# Patient Record
Sex: Female | Born: 1960 | Race: Black or African American | Hispanic: No | Smoking: Current every day smoker
Health system: Southern US, Community
[De-identification: ages and names within clinical notes are randomized; demographics above are authoritative.]

## PROBLEM LIST (undated history)

## (undated) DIAGNOSIS — F32A Depression, unspecified: Secondary | ICD-10-CM

## (undated) DIAGNOSIS — F329 Major depressive disorder, single episode, unspecified: Secondary | ICD-10-CM

## (undated) DIAGNOSIS — M1990 Unspecified osteoarthritis, unspecified site: Secondary | ICD-10-CM

## (undated) DIAGNOSIS — IMO0002 Reserved for concepts with insufficient information to code with codable children: Secondary | ICD-10-CM

## (undated) DIAGNOSIS — I10 Essential (primary) hypertension: Secondary | ICD-10-CM

## (undated) DIAGNOSIS — M5430 Sciatica, unspecified side: Secondary | ICD-10-CM

## (undated) HISTORY — PX: DILATION AND CURETTAGE OF UTERUS: SHX78

## (undated) HISTORY — DX: Essential (primary) hypertension: I10

## (undated) HISTORY — PX: BREAST SURGERY: SHX581

## (undated) HISTORY — DX: Unspecified osteoarthritis, unspecified site: M19.90

## (undated) HISTORY — DX: Depression, unspecified: F32.A

## (undated) HISTORY — DX: Reserved for concepts with insufficient information to code with codable children: IMO0002

## (undated) HISTORY — PX: EYE SURGERY: SHX253

## (undated) HISTORY — DX: Major depressive disorder, single episode, unspecified: F32.9

## (undated) HISTORY — DX: Sciatica, unspecified side: M54.30

---

## 1997-04-23 ENCOUNTER — Encounter (HOSPITAL_COMMUNITY): Admission: RE | Admit: 1997-04-23 | Discharge: 1997-07-22 | Payer: Self-pay | Admitting: Psychiatry

## 1997-07-23 ENCOUNTER — Inpatient Hospital Stay (HOSPITAL_COMMUNITY): Admission: EM | Admit: 1997-07-23 | Discharge: 1997-07-27 | Payer: Self-pay | Admitting: Emergency Medicine

## 1998-12-25 ENCOUNTER — Inpatient Hospital Stay (HOSPITAL_COMMUNITY): Admission: EM | Admit: 1998-12-25 | Discharge: 1998-12-30 | Payer: Self-pay | Admitting: *Deleted

## 1999-01-03 ENCOUNTER — Other Ambulatory Visit: Admission: RE | Admit: 1999-01-03 | Discharge: 1999-01-11 | Payer: Self-pay

## 1999-07-16 ENCOUNTER — Emergency Department (HOSPITAL_COMMUNITY): Admission: EM | Admit: 1999-07-16 | Discharge: 1999-07-16 | Payer: Self-pay | Admitting: *Deleted

## 2000-07-14 ENCOUNTER — Emergency Department (HOSPITAL_COMMUNITY): Admission: EM | Admit: 2000-07-14 | Discharge: 2000-07-14 | Payer: Self-pay | Admitting: Emergency Medicine

## 2000-08-01 ENCOUNTER — Emergency Department (HOSPITAL_COMMUNITY): Admission: EM | Admit: 2000-08-01 | Discharge: 2000-08-01 | Payer: Self-pay | Admitting: Emergency Medicine

## 2000-08-13 ENCOUNTER — Emergency Department (HOSPITAL_COMMUNITY): Admission: EM | Admit: 2000-08-13 | Discharge: 2000-08-13 | Payer: Self-pay | Admitting: Emergency Medicine

## 2001-10-14 ENCOUNTER — Emergency Department (HOSPITAL_COMMUNITY): Admission: EM | Admit: 2001-10-14 | Discharge: 2001-10-14 | Payer: Self-pay | Admitting: Emergency Medicine

## 2002-03-02 ENCOUNTER — Emergency Department (HOSPITAL_COMMUNITY): Admission: EM | Admit: 2002-03-02 | Discharge: 2002-03-02 | Payer: Self-pay | Admitting: Emergency Medicine

## 2002-08-19 ENCOUNTER — Emergency Department (HOSPITAL_COMMUNITY): Admission: EM | Admit: 2002-08-19 | Discharge: 2002-08-19 | Payer: Self-pay | Admitting: Emergency Medicine

## 2002-12-26 ENCOUNTER — Emergency Department (HOSPITAL_COMMUNITY): Admission: EM | Admit: 2002-12-26 | Discharge: 2002-12-26 | Payer: Self-pay | Admitting: Emergency Medicine

## 2005-07-18 ENCOUNTER — Emergency Department (HOSPITAL_COMMUNITY): Admission: EM | Admit: 2005-07-18 | Discharge: 2005-07-18 | Payer: Self-pay | Admitting: Emergency Medicine

## 2005-07-30 ENCOUNTER — Emergency Department (HOSPITAL_COMMUNITY): Admission: EM | Admit: 2005-07-30 | Discharge: 2005-07-30 | Payer: Self-pay | Admitting: Family Medicine

## 2007-10-25 ENCOUNTER — Emergency Department (HOSPITAL_COMMUNITY): Admission: EM | Admit: 2007-10-25 | Discharge: 2007-10-25 | Payer: Self-pay | Admitting: Emergency Medicine

## 2007-11-20 ENCOUNTER — Emergency Department (HOSPITAL_COMMUNITY): Admission: EM | Admit: 2007-11-20 | Discharge: 2007-11-20 | Payer: Self-pay | Admitting: Emergency Medicine

## 2008-02-12 ENCOUNTER — Emergency Department (HOSPITAL_COMMUNITY): Admission: EM | Admit: 2008-02-12 | Discharge: 2008-02-12 | Payer: Self-pay | Admitting: Emergency Medicine

## 2008-02-14 ENCOUNTER — Emergency Department (HOSPITAL_COMMUNITY): Admission: EM | Admit: 2008-02-14 | Discharge: 2008-02-14 | Payer: Self-pay | Admitting: Emergency Medicine

## 2008-03-16 ENCOUNTER — Emergency Department (HOSPITAL_COMMUNITY): Admission: EM | Admit: 2008-03-16 | Discharge: 2008-03-16 | Payer: Self-pay | Admitting: Emergency Medicine

## 2008-03-20 ENCOUNTER — Emergency Department (HOSPITAL_COMMUNITY): Admission: EM | Admit: 2008-03-20 | Discharge: 2008-03-20 | Payer: Self-pay | Admitting: Emergency Medicine

## 2008-03-24 ENCOUNTER — Emergency Department (HOSPITAL_COMMUNITY): Admission: EM | Admit: 2008-03-24 | Discharge: 2008-03-24 | Payer: Self-pay | Admitting: Emergency Medicine

## 2008-04-01 ENCOUNTER — Emergency Department (HOSPITAL_COMMUNITY): Admission: EM | Admit: 2008-04-01 | Discharge: 2008-04-01 | Payer: Self-pay | Admitting: Emergency Medicine

## 2008-04-08 ENCOUNTER — Ambulatory Visit: Payer: Self-pay | Admitting: Sports Medicine

## 2008-04-08 DIAGNOSIS — M25519 Pain in unspecified shoulder: Secondary | ICD-10-CM

## 2008-04-08 DIAGNOSIS — S139XXA Sprain of joints and ligaments of unspecified parts of neck, initial encounter: Secondary | ICD-10-CM | POA: Insufficient documentation

## 2008-04-08 DIAGNOSIS — I10 Essential (primary) hypertension: Secondary | ICD-10-CM

## 2009-02-16 ENCOUNTER — Inpatient Hospital Stay (HOSPITAL_COMMUNITY): Admission: EM | Admit: 2009-02-16 | Discharge: 2009-02-18 | Payer: Self-pay | Admitting: Emergency Medicine

## 2009-07-13 ENCOUNTER — Emergency Department (HOSPITAL_COMMUNITY): Admission: EM | Admit: 2009-07-13 | Discharge: 2009-07-14 | Payer: Self-pay | Admitting: Emergency Medicine

## 2009-08-08 ENCOUNTER — Emergency Department (HOSPITAL_BASED_OUTPATIENT_CLINIC_OR_DEPARTMENT_OTHER): Admission: EM | Admit: 2009-08-08 | Discharge: 2009-08-08 | Payer: Self-pay | Admitting: Emergency Medicine

## 2009-08-08 ENCOUNTER — Ambulatory Visit: Payer: Self-pay | Admitting: Diagnostic Radiology

## 2010-04-08 LAB — URINALYSIS, ROUTINE W REFLEX MICROSCOPIC
Bilirubin Urine: NEGATIVE
Glucose, UA: NEGATIVE mg/dL
Hgb urine dipstick: NEGATIVE
Ketones, ur: NEGATIVE mg/dL
Nitrite: NEGATIVE
Protein, ur: NEGATIVE mg/dL
Specific Gravity, Urine: 1.016 (ref 1.005–1.030)
Urobilinogen, UA: 0.2 mg/dL (ref 0.0–1.0)
pH: 7.5 (ref 5.0–8.0)

## 2010-04-08 LAB — BASIC METABOLIC PANEL
BUN: 14 mg/dL (ref 6–23)
CO2: 26 mEq/L (ref 19–32)
Calcium: 8.9 mg/dL (ref 8.4–10.5)
Chloride: 107 mEq/L (ref 96–112)
Creatinine, Ser: 0.8 mg/dL (ref 0.4–1.2)
GFR calc Af Amer: >60 mL/min (ref >60)
GFR calc non Af Amer: >60 mL/min (ref >60)
Glucose, Bld: 97 mg/dL (ref 70–99)
Potassium: 3.9 mEq/L (ref 3.5–5.1)
Sodium: 143 mEq/L (ref 135–145)

## 2010-04-08 LAB — PREGNANCY, URINE: Preg Test, Ur: NEGATIVE

## 2010-04-09 LAB — BASIC METABOLIC PANEL
BUN: 5 mg/dL — ABNORMAL LOW (ref 6–23)
BUN: 6 mg/dL (ref 6–23)
CO2: 26 mEq/L (ref 19–32)
Calcium: 8.5 mg/dL (ref 8.4–10.5)
Chloride: 100 mEq/L (ref 96–112)
Chloride: 106 mEq/L (ref 96–112)
Creatinine, Ser: 0.83 mg/dL (ref 0.4–1.2)
Creatinine, Ser: 0.88 mg/dL (ref 0.4–1.2)
GFR calc Af Amer: >60 mL/min (ref >60)
GFR calc non Af Amer: >60 mL/min (ref >60)
Glucose, Bld: 104 mg/dL — ABNORMAL HIGH (ref 70–99)
Glucose, Bld: 106 mg/dL — ABNORMAL HIGH (ref 70–99)
Potassium: 3.1 mEq/L — ABNORMAL LOW (ref 3.5–5.1)
Sodium: 135 mEq/L (ref 135–145)

## 2010-04-09 LAB — COMPREHENSIVE METABOLIC PANEL
ALT: 17 U/L (ref 0–35)
AST: 20 U/L (ref 0–37)
Alkaline Phosphatase: 50 U/L (ref 39–117)
CO2: 27 mEq/L (ref 19–32)
Calcium: 9.3 mg/dL (ref 8.4–10.5)
GFR calc Af Amer: >60 mL/min (ref >60)
GFR calc non Af Amer: >60 mL/min (ref >60)
Glucose, Bld: 105 mg/dL — ABNORMAL HIGH (ref 70–99)
Potassium: 3.6 mEq/L (ref 3.5–5.1)
Sodium: 138 mEq/L (ref 135–145)

## 2010-04-09 LAB — URINALYSIS, ROUTINE W REFLEX MICROSCOPIC
Bilirubin Urine: NEGATIVE
Ketones, ur: NEGATIVE mg/dL
Nitrite: NEGATIVE
Protein, ur: NEGATIVE mg/dL
Urobilinogen, UA: 0.2 mg/dL (ref 0.0–1.0)
pH: 5.5 (ref 5.0–8.0)

## 2010-04-09 LAB — CBC
HCT: 21.9 % — ABNORMAL LOW (ref 36.0–46.0)
HCT: 28 % — ABNORMAL LOW (ref 36.0–46.0)
Hemoglobin: 6.6 g/dL — CL (ref 12.0–15.0)
Hemoglobin: 8.3 g/dL — ABNORMAL LOW (ref 12.0–15.0)
MCV: 65.1 fL — ABNORMAL LOW (ref 78.0–100.0)
MCV: 65.2 fL — ABNORMAL LOW (ref 78.0–100.0)
Platelets: 331 10*3/uL (ref 150–400)
Platelets: 362 10*3/uL (ref 150–400)
RBC: 4.21 MIL/uL (ref 3.87–5.11)
RDW: 19.1 % — ABNORMAL HIGH (ref 11.5–15.5)
RDW: 19.6 % — ABNORMAL HIGH (ref 11.5–15.5)
WBC: 5.8 10*3/uL (ref 4.0–10.5)
WBC: 5.9 10*3/uL (ref 4.0–10.5)
WBC: 5.8 10*3/uL (ref 4.0–10.5)

## 2010-04-09 LAB — RAPID URINE DRUG SCREEN, HOSP PERFORMED
Cocaine: POSITIVE — AB
Tetrahydrocannabinol: NOT DETECTED

## 2010-04-09 LAB — DIFFERENTIAL
Basophils Relative: 1 % (ref 0–1)
Eosinophils Absolute: 0.1 10*3/uL (ref 0.0–0.7)
Eosinophils Relative: 1 % (ref 0–5)
Lymphs Abs: 1.8 10*3/uL (ref 0.7–4.0)
Monocytes Absolute: 0.5 10*3/uL (ref 0.1–1.0)
Neutrophils Relative %: 58 % (ref 43–77)

## 2010-04-09 LAB — ETHANOL: Alcohol, Ethyl (B): <5 mg/dL (ref 0–10)

## 2010-05-04 LAB — BASIC METABOLIC PANEL
BUN: 8 mg/dL (ref 6–23)
CO2: 26 mEq/L (ref 19–32)
Calcium: 8.8 mg/dL (ref 8.4–10.5)
Chloride: 105 mEq/L (ref 96–112)
Creatinine, Ser: 0.9 mg/dL (ref 0.4–1.2)
GFR calc Af Amer: >60 mL/min (ref >60)

## 2010-05-08 LAB — POCT PREGNANCY, URINE: Preg Test, Ur: NEGATIVE

## 2010-10-23 LAB — POCT I-STAT, CHEM 8
Calcium, Ion: 1.06 — ABNORMAL LOW
Creatinine, Ser: 1.1
Glucose, Bld: 91
Hemoglobin: 9.9 — ABNORMAL LOW
TCO2: 25

## 2010-10-23 LAB — URINALYSIS, ROUTINE W REFLEX MICROSCOPIC
Glucose, UA: NEGATIVE
Hgb urine dipstick: NEGATIVE
Specific Gravity, Urine: 1.019
Urobilinogen, UA: 0.2
pH: 5.5

## 2010-10-23 LAB — URINE MICROSCOPIC-ADD ON

## 2010-10-23 LAB — CBC
HCT: 27.3 — ABNORMAL LOW
Hemoglobin: 8.6 — ABNORMAL LOW
MCHC: 31.6
Platelets: 461 — ABNORMAL HIGH
RDW: 19.1 — ABNORMAL HIGH

## 2010-10-23 LAB — WET PREP, GENITAL: Clue Cells Wet Prep HPF POC: NONE SEEN

## 2010-10-23 LAB — DIFFERENTIAL
Basophils Absolute: 0.1
Basophils Relative: 2 — ABNORMAL HIGH
Eosinophils Absolute: 0
Eosinophils Relative: 1
Monocytes Absolute: 0.6

## 2010-10-23 LAB — GC/CHLAMYDIA PROBE AMP, GENITAL: GC Probe Amp, Genital: NEGATIVE

## 2010-10-31 ENCOUNTER — Emergency Department (HOSPITAL_COMMUNITY)
Admission: EM | Admit: 2010-10-31 | Discharge: 2010-10-31 | Disposition: A | Discharge status: Home / Self Care | Payer: Self-pay | Attending: Emergency Medicine | Admitting: Emergency Medicine

## 2010-10-31 DIAGNOSIS — M25569 Pain in unspecified knee: Secondary | ICD-10-CM | POA: Insufficient documentation

## 2010-10-31 DIAGNOSIS — Z79899 Other long term (current) drug therapy: Secondary | ICD-10-CM | POA: Insufficient documentation

## 2010-10-31 DIAGNOSIS — M5430 Sciatica, unspecified side: Secondary | ICD-10-CM | POA: Insufficient documentation

## 2010-10-31 DIAGNOSIS — I10 Essential (primary) hypertension: Secondary | ICD-10-CM | POA: Insufficient documentation

## 2010-10-31 DIAGNOSIS — IMO0001 Reserved for inherently not codable concepts without codable children: Secondary | ICD-10-CM | POA: Insufficient documentation

## 2010-10-31 DIAGNOSIS — M79609 Pain in unspecified limb: Secondary | ICD-10-CM | POA: Insufficient documentation

## 2010-11-23 DIAGNOSIS — M5430 Sciatica, unspecified side: Secondary | ICD-10-CM

## 2010-11-23 HISTORY — DX: Sciatica, unspecified side: M54.30

## 2010-12-28 ENCOUNTER — Ambulatory Visit (INDEPENDENT_AMBULATORY_CARE_PROVIDER_SITE_OTHER): Payer: Self-pay | Admitting: Family Medicine

## 2010-12-28 ENCOUNTER — Encounter: Payer: Self-pay | Admitting: Family Medicine

## 2010-12-28 VITALS — BP 182/122 | HR 85 | Temp 97.6°F | Ht 64.0 in | Wt 154.0 lb

## 2010-12-28 DIAGNOSIS — F329 Major depressive disorder, single episode, unspecified: Secondary | ICD-10-CM

## 2010-12-28 DIAGNOSIS — I10 Essential (primary) hypertension: Secondary | ICD-10-CM

## 2010-12-28 DIAGNOSIS — M5430 Sciatica, unspecified side: Secondary | ICD-10-CM

## 2010-12-28 DIAGNOSIS — Z0000 Encounter for general adult medical examination without abnormal findings: Secondary | ICD-10-CM

## 2010-12-28 LAB — BASIC METABOLIC PANEL
BUN: 13 mg/dL (ref 6–23)
Calcium: 10.1 mg/dL (ref 8.4–10.5)
Glucose, Bld: 83 mg/dL (ref 70–99)
Sodium: 142 mEq/L (ref 135–145)

## 2010-12-28 MED ORDER — LISINOPRIL-HYDROCHLOROTHIAZIDE 10-12.5 MG PO TABS: 1.0000 | ORAL_TABLET | Freq: Every day | ORAL | Status: DC

## 2010-12-28 NOTE — Patient Instructions (Signed)
It was great meeting you today! Your blood pressure was high today and I would like to start you on two medications: lisinopril and hydrochlorothiazide. They should not be too expensive. I also need to obtain some labwork before starting the medication and in 3 weeks after starting it. Next visit, I will check your cholesterol, so come fasting if you can.  For your low back pain, I am giving you some exercises to do at home. If the pain gets bad, lets start with tylenol 1-2 tabs every 4 to 6 hrs to help with the pain. If it continues going on we will get some xrays. We can also try to set you up with physical therapy when you get the orange card.

## 2010-12-30 ENCOUNTER — Other Ambulatory Visit: Payer: Self-pay | Admitting: Family Medicine

## 2010-12-30 ENCOUNTER — Encounter: Payer: Self-pay | Admitting: Family Medicine

## 2010-12-30 DIAGNOSIS — Z0000 Encounter for general adult medical examination without abnormal findings: Secondary | ICD-10-CM | POA: Insufficient documentation

## 2010-12-30 DIAGNOSIS — M545 Low back pain: Secondary | ICD-10-CM | POA: Insufficient documentation

## 2010-12-30 DIAGNOSIS — F329 Major depressive disorder, single episode, unspecified: Secondary | ICD-10-CM | POA: Insufficient documentation

## 2010-12-30 DIAGNOSIS — I10 Essential (primary) hypertension: Secondary | ICD-10-CM
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
## 2010-12-30 NOTE — Assessment & Plan Note (Signed)
-   Will check fasting lipid panel when patient follows up.  - Patient filled out form for mammogram scholarship - will obtain pap smear at follow up visit since this has not been done anytime recently and patient does have history of abnormal pap smear - patient given flu shot during visit. Will offer tetanus shot at next visit - patient needs screening colonoscopy
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
## 2010-12-30 NOTE — Assessment & Plan Note (Signed)
Patient given low back pain exercises. Patient to take tylenol for pain and to avoid NSAIDs given elevated blood pressure. If pain is still present at next visit, will obtain xrays. Will also refer to physical therapy once patient obtains orange card.

## 2010-12-30 NOTE — Assessment & Plan Note (Addendum)
Hypertension stage 2: will start on dual agent therapy: lisinopril/hctz 10/12.5. This should be more affordable than norvasc. Will check baseline BMet for Creatinine and potassium levels. Recheck BMet in 2-3 weeks when patient follows up.

## 2011-01-29 ENCOUNTER — Other Ambulatory Visit: Payer: Self-pay | Admitting: Family Medicine

## 2011-01-29 DIAGNOSIS — Z1231 Encounter for screening mammogram for malignant neoplasm of breast: Secondary | ICD-10-CM

## 2011-02-12 ENCOUNTER — Ambulatory Visit (HOSPITAL_COMMUNITY): Payer: Self-pay

## 2011-02-26 ENCOUNTER — Encounter: Payer: Self-pay | Admitting: Family Medicine

## 2011-02-26 ENCOUNTER — Ambulatory Visit (INDEPENDENT_AMBULATORY_CARE_PROVIDER_SITE_OTHER): Payer: Self-pay | Admitting: Family Medicine

## 2011-02-26 VITALS — BP 199/125 | HR 62 | Ht 64.0 in | Wt 162.0 lb

## 2011-02-26 DIAGNOSIS — I10 Essential (primary) hypertension: Secondary | ICD-10-CM

## 2011-02-26 LAB — TSH: TSH: 2.293 u[IU]/mL (ref 0.350–4.500)

## 2011-02-26 LAB — CBC WITH DIFFERENTIAL/PLATELET
Eosinophils Absolute: 0.1 10*3/uL (ref 0.0–0.7)
Eosinophils Relative: 2 % (ref 0–5)
Hemoglobin: 12.7 g/dL (ref 12.0–15.0)
Lymphs Abs: 2.1 10*3/uL (ref 0.7–4.0)
MCH: 28.2 pg (ref 26.0–34.0)
MCV: 89.3 fL (ref 78.0–100.0)
Monocytes Relative: 8 % (ref 3–12)
RBC: 4.5 MIL/uL (ref 3.87–5.11)

## 2011-02-26 LAB — LIPID PANEL
Cholesterol: 214 mg/dL — ABNORMAL HIGH (ref 0–200)
Total CHOL/HDL Ratio: 3.1 Ratio

## 2011-02-26 LAB — COMPREHENSIVE METABOLIC PANEL
CO2: 27 mEq/L (ref 19–32)
Creat: 1.02 mg/dL (ref 0.50–1.10)
Glucose, Bld: 84 mg/dL (ref 70–99)
Total Bilirubin: 0.3 mg/dL (ref 0.3–1.2)

## 2011-02-26 MED ORDER — HYDROCHLOROTHIAZIDE 12.5 MG PO TABS: 12.5000 mg | ORAL_TABLET | Freq: Every day | ORAL | Status: DC

## 2011-02-26 MED ORDER — METOPROLOL TARTRATE 50 MG PO TABS: 50.0000 mg | ORAL_TABLET | Freq: Two times a day (BID) | ORAL | Status: DC

## 2011-02-26 MED ORDER — ASPIRIN 81 MG PO TBEC: 81.0000 mg | DELAYED_RELEASE_TABLET | Freq: Every day | ORAL | Status: AC

## 2011-02-26 NOTE — Progress Notes (Signed)
Patient ID: Elaine [name]    DOB: May 21, 1960, 51 y.o.   MRN: [id number] --- Subjective:  Elaine [name] is a 51 y.o.female who presents for follow up on her blood pressure medication. Was prescribed lisinopril-hctz 2 months ago for hypertension but patient was unable to fill the medication due to lack of money. Got the orange card today.  She denies any headache, any change in vision, any swelling in the legs or any shortness of breath. Did have an episode of of mid chest discomfort yesterday, which was relieved on its own. Was after eating but did not appear to be heartburn. No diaphoresis, no nausea, no vomiting, no radiation of pain, no shortness of breath. No family history of heart disease.    Objective: Filed Vitals:   02/26/11 1429  BP: 199/125  Pulse: 62    Physical Examination:   General appearance - alert, well appearing, and in no distress Nose - normal and patent, no erythema, discharge or polyps Mouth - mucous membranes moist, pharynx normal without lesions Neck - supple, no significant adenopathy Eyes: left eye more sunken in orbit, EOMI Chest - clear to auscultation, no wheezes, rales or rhonchi, symmetric air entry Heart - normal rate, regular rhythm, normal S1, S2, no murmurs, rubs, clicks or gallops Abdomen - soft, nontender, nondistended, no masses or organomegaly Extremities - peripheral pulses normal, trace edema, no clubbing or cyanosis

## 2011-02-26 NOTE — Assessment & Plan Note (Signed)
Hypertension very poorly controled. Stressed the severity of her blood pressure and need to get blood pressure medication filled. Will start her on hydrochlorothiazide 12.5mg  and metoprolol 50mg  po bid which she should be able to get [facility] with the orange card. Will also start patient on daily aspirin.  Will check CBC (given history of anemia), CM, TSH and non fasting lipid panel. Patient to return to clinic by the end of the week to recheck her blood pressure especially with her complaint of chest discomfort.  Precepted with Dr. Leveda Anna.

## 2011-02-26 NOTE — Patient Instructions (Signed)
You need to come back to get your blood pressure checked by the end of the week

## 2011-03-01 ENCOUNTER — Ambulatory Visit (INDEPENDENT_AMBULATORY_CARE_PROVIDER_SITE_OTHER): Payer: Self-pay | Admitting: Family Medicine

## 2011-03-01 ENCOUNTER — Encounter: Payer: Self-pay | Admitting: Family Medicine

## 2011-03-01 VITALS — BP 168/98 | HR 47 | Temp 97.5°F | Ht 64.0 in | Wt 161.0 lb

## 2011-03-01 DIAGNOSIS — I10 Essential (primary) hypertension: Secondary | ICD-10-CM

## 2011-03-01 NOTE — Patient Instructions (Addendum)
Thank you for coming in today. Please continue taking Hydrochrloathizide 12.5 mg daily. Look at your bottle. Are pills 25 or 12.5?  Take 1/2 pill of the metoporol twice a day.  Consider getting a pill box with morning and evening dosing so you can remember to take your pills.  Let Elaine [name] know if you feel light headed or dizzy.  We will sort you blood pressure medicines out eventually.  It will take some trial and error.. Let Elaine [name] know if you feel bad or have chest pains or trouble breathing.  Please follow up with Dr. Gwenlyn Saran or myself in 1 week.

## 2011-03-02 NOTE — Progress Notes (Signed)
Elaine [name] is a 51 y.o. female who presents to [facility] today for HTn follow up. Was seen recently by PCP who started HCTZ and Metoporol. She filled and started taking the medications yesterday. She feel well without any chest pain, palpitations, dyspnea, dizziness or syncope.    PMH reviewed. Sig for HTN ROS as above otherwise neg Medications reviewed. Current Outpatient Prescriptions  Medication Sig Dispense Refill  . aspirin 81 MG EC tablet Take 1 tablet (81 mg total) by mouth daily. Swallow whole.  30 tablet  12  . buPROPion (WELLBUTRIN SR) 150 MG 12 hr tablet Take 150 mg by mouth 2 (two) times daily.        . hydrochlorothiazide (HYDRODIURIL) 12.5 MG tablet Take 1 tablet (12.5 mg total) by mouth daily.  30 tablet  3  . metoprolol (LOPRESSOR) 50 MG tablet Take 1 tablet (50 mg total) by mouth 2 (two) times daily.  120 tablet  3    Exam:  BP 168/98  Pulse 47  Temp(Src) 97.5 F (36.4 C) (Oral)  Ht 5\' 4"  (1.626 m)  Wt 161 lb (73.029 kg)  BMI 27.64 kg/m2 Gen: Well NAD HEENT: EOMI,  MMM Lungs: CTABL Nl WOB Heart: Huston Foley but regular no MRG Abd: NABS, NT, ND Exts: Non edematous BL  LE, warm and well perfused.

## 2011-03-02 NOTE — Assessment & Plan Note (Signed)
BP improved but not ideal. Also bradycardia likely due to excessive beta blockaid. However she is asymptomatic.  Plan to reduce Metoporol by half and f/u with PCP in 1 week. Pt expresses understanding.

## 2011-03-06 ENCOUNTER — Ambulatory Visit: Payer: Self-pay | Admitting: Family Medicine

## 2011-03-09 ENCOUNTER — Ambulatory Visit (HOSPITAL_COMMUNITY)
Admission: RE | Admit: 2011-03-09 | Discharge: 2011-03-09 | Disposition: A | Discharge status: Home / Self Care | Payer: Self-pay | Source: Ambulatory Visit | Attending: Family Medicine | Admitting: Family Medicine

## 2011-03-09 DIAGNOSIS — Z1231 Encounter for screening mammogram for malignant neoplasm of breast: Secondary | ICD-10-CM | POA: Insufficient documentation

## 2011-03-20 ENCOUNTER — Other Ambulatory Visit: Payer: Self-pay | Admitting: Family Medicine

## 2011-03-20 DIAGNOSIS — R928 Other abnormal and inconclusive findings on diagnostic imaging of breast: Secondary | ICD-10-CM

## 2011-04-10 ENCOUNTER — Ambulatory Visit (INDEPENDENT_AMBULATORY_CARE_PROVIDER_SITE_OTHER): Payer: Self-pay | Admitting: *Deleted

## 2011-04-10 VITALS — BP 160/84 | HR 86 | Temp 97.5°F | Ht 64.0 in | Wt 164.4 lb

## 2011-04-10 DIAGNOSIS — Z01419 Encounter for gynecological examination (general) (routine) without abnormal findings: Secondary | ICD-10-CM

## 2011-04-10 NOTE — Progress Notes (Signed)
Patient referred from the [facility] due to needing additional imaging of bilateral breasts. Screening mammogram was 03/09/11.  Pap Smear:    Pap smear completed today. Per patient last Pap smear was over 5 years ago and was normal. Patient stated has a history of a cone biopsy between 5-10 years ago for an abnormal Pap smear. No pap smear results in EPIC.  Physical exam: Breasts Breasts symmetrical. Scars bilateral lower breasts from bilateral breast reduction surgery that was around 7 years ago. No nipple retraction bilateral breasts. No nipple discharge bilateral breasts. No lymphadenopathy. No lumps palpated bilateral breasts. No complaints of pain or tenderness on exam. Patient referred to the [facility] for bilateral diagnostic mammogram for additional imaging per recommendation. Appointment scheduled Tuesday, April 17, 2011 at 1030.          Pelvic/Bimanual   Ext Genitalia No lesions, no swelling and no discharge observed on external genitalia.         Vagina Vagina pink and normal texture. No lesions or discharge observed in vagina.          Cervix Cervix is present. Cervix pink and of normal texture. No discharge observed.     Uterus Uterus is present and palpable. Uterus in normal position and normal size.        Adnexae Bilateral ovaries present and palpable. No tenderness on palpation.          Rectovaginal No rectal exam completed today since patient had no rectal complaints. No skin abnormalities observed on exam.

## 2011-04-10 NOTE — Patient Instructions (Signed)
Taught patient how to perform BSE.  Let her know [facility] will cover Pap smears every 3 years unless has a history of abnormal Pap smears. Patient is scheduled for a bilateral diagnostic mammogram Tuesday, April 17, 2011 at 1030. Patient aware of appointment and will be there. Let patient know will follow up with her within the next couple weeks with results. Patient verbalized understanding.

## 2011-04-16 ENCOUNTER — Other Ambulatory Visit: Payer: Self-pay | Admitting: Obstetrics and Gynecology

## 2011-04-16 MED ORDER — METRONIDAZOLE 500 MG PO TABS: 500.0000 mg | ORAL_TABLET | Freq: Two times a day (BID) | ORAL | Status: DC

## 2011-04-17 ENCOUNTER — Ambulatory Visit
Admission: RE | Admit: 2011-04-17 | Discharge: 2011-04-17 | Disposition: A | Discharge status: Home / Self Care | Payer: No Typology Code available for payment source | Source: Ambulatory Visit | Attending: *Deleted | Admitting: *Deleted

## 2011-04-17 DIAGNOSIS — R928 Other abnormal and inconclusive findings on diagnostic imaging of breast: Secondary | ICD-10-CM

## 2011-04-19 ENCOUNTER — Encounter: Payer: Self-pay | Admitting: Family Medicine

## 2011-04-19 ENCOUNTER — Ambulatory Visit (INDEPENDENT_AMBULATORY_CARE_PROVIDER_SITE_OTHER): Payer: No Typology Code available for payment source | Admitting: Family Medicine

## 2011-04-19 ENCOUNTER — Telehealth: Payer: Self-pay | Admitting: *Deleted

## 2011-04-19 VITALS — BP 167/100 | HR 63 | Ht 64.0 in | Wt 164.0 lb

## 2011-04-19 DIAGNOSIS — I10 Essential (primary) hypertension: Secondary | ICD-10-CM

## 2011-04-19 DIAGNOSIS — M545 Low back pain: Secondary | ICD-10-CM

## 2011-04-19 DIAGNOSIS — M549 Dorsalgia, unspecified: Secondary | ICD-10-CM

## 2011-04-19 DIAGNOSIS — H269 Unspecified cataract: Secondary | ICD-10-CM

## 2011-04-19 MED ORDER — DICLOFENAC SODIUM 75 MG PO TBEC: 75.0000 mg | DELAYED_RELEASE_TABLET | Freq: Two times a day (BID) | ORAL | Status: DC

## 2011-04-19 MED ORDER — AMLODIPINE BESYLATE 10 MG PO TABS: 10.0000 mg | ORAL_TABLET | Freq: Every day | ORAL | Status: DC

## 2011-04-19 NOTE — Telephone Encounter (Signed)
Attempted to call patient to give results to Pap smear and let her know a prescription has been sent to her pharmacy. Patient's family member answered phone. Left message for patient to call me back.

## 2011-04-19 NOTE — Patient Instructions (Signed)
For your back pain, I am prescribing a heavy duty anti-inflammatory called diclofenac to take twice daily. Take tylenol in between to help with pain (you can take up to 3000 mg in one day) Continue staying active. I can send a referral for physical therapy to help with exercises that may help. I am also giving you some exercises to strengthen your back at home. The key is to keep as active as possible.  For the eye doctor appointment, we are going to try and get an appointment as soon as possible and will let you know when we get it.   I am also starting the amlodipine to take once a day. Continue the Hydrochlorothiazide as well.   I would like to see you back in two weeks for the back pain.

## 2011-04-21 DIAGNOSIS — H269 Unspecified cataract: Secondary | ICD-10-CM | POA: Insufficient documentation

## 2011-04-21 NOTE — Assessment & Plan Note (Signed)
Blood pressure not well controlled at 167/100. Patient had only been on one agent. Given pericardia with metoprolol we'll stop that for now. Patient mentions that she had good blood pressure control in the past with amlodipine. Will start amlodipine 10 mg daily in addition to hydrochlorothiazide 12.5 mg. Electrolytes in February 2013 within normal limits.

## 2011-04-21 NOTE — Assessment & Plan Note (Signed)
Lower back pain most likely due to a muscle strain or sciatica: Prescribed diclofenac 75 mg twice a day with instruction to take Tylenol in between. No obvious spasm on exam so will not prescribe muscle relaxants. Gave exercises for lower back pain. Patient to follow up in 2 weeks. If pain is still there will image her back and refer her to physical therapy.

## 2011-04-21 NOTE — Assessment & Plan Note (Signed)
Will refer to ophthalmology. 

## 2011-04-21 NOTE — Progress Notes (Signed)
Patient ID: Sondi Desch, female   DOB: 04-Nov-1960, 51 y.o.   MRN: [id number] Patient ID: Nalani Andreen    DOB: 08-11-1960, 51 y.o.   MRN: [id number] --- Subjective:  Breianna is a 51 y.o.female with h/o HTN, depression who presents with request for ophtalmology referral as well as back pain: - Back pain: started a month ago, has become worse in last 2 days located on the right lower back. No weakness no numbness in lower extremities no bowel her urinary incontinence. No obvious trauma to the area. Feeling of drilling in her back. Worse with bending better with standing straight. Has had sciatica before but it was more on the left side and doesn't feel like this. Her friend gave her ibuprofen 800 mg and Flexeril last night which did not help. No cancer history. - HTN: Did not take metoprolol after her last visit since she was told that she was bradycardic with it. Has only been taking hydrochlorothiazide 12.5 mg daily. No shortness of breath no chest pain no change in vision no swelling of the legs, no headache. - Ophthalmology referral: Has a history of cataract in her left eye for which she was supposed to get eye surgery in 2012 but did not get it done. She feels like her vision has gotten worse. 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   Objective: Filed Vitals:   04/19/11 1614  BP: 167/100  Pulse: 63    Physical Examination:   General appearance - alert, well appearing, and in no distress Chest - clear to auscultation, no wheezes, rales or rhonchi, symmetric air entry Heart - normal rate, regular rhythm, normal S1, S2, no murmurs, rubs, clicks or gallops MSK - tenderness to palpation along lumbar and sacral spine, and along right paraspinal muscles. No obvious spasms or muscle tightness. Intact range of motion of the hips bilaterally appear normal range of motion of knees bilaterally. Straight leg negative bilaterally. Symmetric patellar reflexes 2+. Normal gait.

## 2011-04-24 ENCOUNTER — Telehealth: Payer: Self-pay | Admitting: *Deleted

## 2011-04-24 NOTE — Telephone Encounter (Signed)
Telephoned patient at home # left message to return call to Christine Brannock [phone]. 

## 2011-04-27 ENCOUNTER — Telehealth: Payer: Self-pay | Admitting: *Deleted

## 2011-04-27 ENCOUNTER — Telehealth: Payer: Self-pay | Admitting: Advanced Practice Midwife

## 2011-04-27 DIAGNOSIS — A5901 Trichomonal vulvovaginitis: Secondary | ICD-10-CM

## 2011-04-27 MED ORDER — METRONIDAZOLE 500 MG PO TABS: 2000.0000 mg | ORAL_TABLET | Freq: Once | ORAL | Status: AC

## 2011-04-27 NOTE — Telephone Encounter (Signed)
Telephoned patient left message to return call to Fonnie Mu [phone].

## 2011-04-27 NOTE — Telephone Encounter (Signed)
Rx for Flagyl for Trich Dx at Cervical Cancer screening E-Rx to [facility] on [address]. Pearla Dubonnet left.  Dorathy Kinsman 04/27/2011 6:08 PM

## 2011-04-27 NOTE — Telephone Encounter (Signed)
Patient returned phone call. Gave patient results to Pap smear and let her know it showed Trichomonas. The prescription Dr. Jolayne Panther e-scribed has expired spoke with Dorathy Kinsman, CNM and will resend prescription for Flagyl. Told patient the importance of being treated along with her partner. Let her know her partner can be treated [facility] of charge [facility]. Told patient to avoid alcohol while taking Flagyl. Will send letter to patient reminding her of the recommendations of a follow up left breast diagnostic mammogram in 6 months. Patient verbalized understanding.

## 2011-04-30 ENCOUNTER — Telehealth: Payer: Self-pay | Admitting: *Deleted

## 2011-04-30 NOTE — Telephone Encounter (Signed)
Attempted to call to follow up if she got her prescription. Prescription has been e-scribed for patient. Patient was not home at time of phone call. Left message for patient to call me back.

## 2011-06-01 ENCOUNTER — Encounter (HOSPITAL_COMMUNITY): Payer: Self-pay | Admitting: *Deleted

## 2011-06-01 ENCOUNTER — Emergency Department (HOSPITAL_COMMUNITY)
Admission: EM | Admit: 2011-06-01 | Discharge: 2011-06-01 | Disposition: A | Discharge status: Home / Self Care | Payer: Self-pay | Attending: Emergency Medicine | Admitting: Emergency Medicine

## 2011-06-01 DIAGNOSIS — F17200 Nicotine dependence, unspecified, uncomplicated: Secondary | ICD-10-CM | POA: Insufficient documentation

## 2011-06-01 DIAGNOSIS — I10 Essential (primary) hypertension: Secondary | ICD-10-CM | POA: Insufficient documentation

## 2011-06-01 DIAGNOSIS — F3289 Other specified depressive episodes: Secondary | ICD-10-CM | POA: Insufficient documentation

## 2011-06-01 DIAGNOSIS — R51 Headache: Secondary | ICD-10-CM | POA: Insufficient documentation

## 2011-06-01 DIAGNOSIS — F329 Major depressive disorder, single episode, unspecified: Secondary | ICD-10-CM | POA: Insufficient documentation

## 2011-06-01 MED ORDER — HYDROCHLOROTHIAZIDE 25 MG PO TABS: 12.5000 mg | ORAL_TABLET | Freq: Every day | ORAL | Status: DC

## 2011-06-01 MED ORDER — METOCLOPRAMIDE HCL 5 MG/ML IJ SOLN
5.0000 mg | INTRAMUSCULAR | Status: DC
  Filled 2011-06-01: qty 2

## 2011-06-01 MED ORDER — DIPHENHYDRAMINE HCL 50 MG/ML IJ SOLN
12.5000 mg | Freq: Once | INTRAMUSCULAR | Status: DC
  Filled 2011-06-01: qty 1

## 2011-06-01 MED ORDER — DEXAMETHASONE SODIUM PHOSPHATE 10 MG/ML IJ SOLN
10.0000 mg | Freq: Once | INTRAMUSCULAR | Status: AC
  Administered 2011-06-01: 10 mg via INTRAMUSCULAR

## 2011-06-01 MED ORDER — METOCLOPRAMIDE HCL 5 MG/ML IJ SOLN
5.0000 mg | Freq: Once | INTRAMUSCULAR | Status: AC
  Administered 2011-06-01: 5 mg via INTRAMUSCULAR

## 2011-06-01 MED ORDER — DEXAMETHASONE SODIUM PHOSPHATE 10 MG/ML IJ SOLN
10.0000 mg | INTRAMUSCULAR | Status: DC
  Filled 2011-06-01: qty 1

## 2011-06-01 MED ORDER — AMLODIPINE BESYLATE 10 MG PO TABS: 10.0000 mg | ORAL_TABLET | Freq: Every day | ORAL | Status: DC

## 2011-06-01 MED ORDER — SODIUM CHLORIDE 0.9 % IV BOLUS (SEPSIS): 1000.0000 mL | Freq: Once | INTRAVENOUS | Status: DC

## 2011-06-01 MED ORDER — DIPHENHYDRAMINE HCL 50 MG/ML IJ SOLN
12.5000 mg | Freq: Once | INTRAMUSCULAR | Status: AC
Start: 2011-06-01 — End: 2011-06-01
  Administered 2011-06-01: 12.5 mg via INTRAMUSCULAR

## 2011-06-01 NOTE — ED Notes (Signed)
Pt c/o HBP, HA since this afternoon.  Has not taken BP meds in 2 weeks.  No blurry vision, n/v
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
## 2011-06-01 NOTE — ED Notes (Signed)
Dr. Maisie Fus notified of pt's repeat blood pressure - states pt is appropriate for discharge at this time, pt requesting to leave d/t her ride being here and waiting. Pt ambulated on d/c independently w/ steady gait, in no acute distress, pleasant and cooperative.

## 2011-06-01 NOTE — ED Notes (Signed)
Blood pressure 220/105 Maisie Fus, MD-resident and Dr. Preston Fleeting EDP made aware - pt to hold on d/c until recheck in approx . Pt remains pleasant w/o complaints.

## 2011-06-01 NOTE — ED Notes (Signed)
Pt c/o generalized HA that began today - pt has been unable to fill her prescribed HCTZ and norvasc  - pt denies blurred vision, weakness, n/v, photophobia or dizziness at present - Pt A&Ox4, in no acute distress.

## 2011-06-01 NOTE — ED Provider Notes (Signed)
51 year old female has not been taking her blood pressure medication for several weeks because she's not been able to afford the new prescriptions. Tonight she developed a headache which is throbbing in both frontal and occipital. Her blood pressure is elevated but only modestly and not high enough to cause her headache. On exam, fundi show no hemorrhage or exudate or papilledema. She has tenderness palpation over the temporalis and paracervical muscles muscles typical of a muscle contraction headache. She shows no evidence any of a serious causes of headache. She will be treated symptomatically  Dione Booze, MD 06/01/11 2257

## 2011-06-03 NOTE — ED Provider Notes (Signed)
I saw and evaluated the patient, reviewed the resident's note and I agree with the findings and plan.   Dione Booze, MD 06/03/11 [phone]

## 2011-09-21 ENCOUNTER — Telehealth: Payer: Self-pay | Admitting: *Deleted

## 2011-09-21 NOTE — Telephone Encounter (Signed)
Telephoned patient and left message to return call to [facility] 

## 2011-09-27 ENCOUNTER — Telehealth: Payer: Self-pay | Admitting: *Deleted

## 2011-09-27 ENCOUNTER — Encounter: Payer: Self-pay | Admitting: *Deleted

## 2011-09-27 NOTE — Telephone Encounter (Signed)
Telephoned patient and left message to return call to [facility] 

## 2013-05-30 ENCOUNTER — Encounter (HOSPITAL_COMMUNITY): Payer: Self-pay | Admitting: Emergency Medicine

## 2013-05-30 ENCOUNTER — Emergency Department (HOSPITAL_COMMUNITY)
Admission: EM | Admit: 2013-05-30 | Discharge: 2013-05-30 | Disposition: A | Discharge status: Home / Self Care | Payer: BC Managed Care – PPO | Attending: Emergency Medicine | Admitting: Emergency Medicine

## 2013-05-30 DIAGNOSIS — M19019 Primary osteoarthritis, unspecified shoulder: Secondary | ICD-10-CM | POA: Insufficient documentation

## 2013-05-30 DIAGNOSIS — Z8659 Personal history of other mental and behavioral disorders: Secondary | ICD-10-CM | POA: Insufficient documentation

## 2013-05-30 DIAGNOSIS — R209 Unspecified disturbances of skin sensation: Secondary | ICD-10-CM | POA: Insufficient documentation

## 2013-05-30 DIAGNOSIS — Z79899 Other long term (current) drug therapy: Secondary | ICD-10-CM | POA: Insufficient documentation

## 2013-05-30 DIAGNOSIS — R252 Cramp and spasm: Secondary | ICD-10-CM | POA: Insufficient documentation

## 2013-05-30 DIAGNOSIS — M549 Dorsalgia, unspecified: Secondary | ICD-10-CM

## 2013-05-30 DIAGNOSIS — I10 Essential (primary) hypertension: Secondary | ICD-10-CM | POA: Insufficient documentation

## 2013-05-30 DIAGNOSIS — F17200 Nicotine dependence, unspecified, uncomplicated: Secondary | ICD-10-CM | POA: Insufficient documentation

## 2013-05-30 LAB — CBC WITH DIFFERENTIAL/PLATELET
BASOS ABS: 0 10*3/uL (ref 0.0–0.1)
BASOS PCT: 1 % (ref 0–1)
EOS ABS: 0.2 10*3/uL (ref 0.0–0.7)
EOS PCT: 4 % (ref 0–5)
HCT: 42.4 % (ref 36.0–46.0)
Hemoglobin: 13.9 g/dL (ref 12.0–15.0)
LYMPHS ABS: 2.3 10*3/uL (ref 0.7–4.0)
Lymphocytes Relative: 35 % (ref 12–46)
MCH: 32.4 pg (ref 26.0–34.0)
MCHC: 32.8 g/dL (ref 30.0–36.0)
MCV: 98.8 fL (ref 78.0–100.0)
Monocytes Absolute: 0.5 10*3/uL (ref 0.1–1.0)
Monocytes Relative: 7 % (ref 3–12)
NEUTROS PCT: 53 % (ref 43–77)
Neutro Abs: 3.5 10*3/uL (ref 1.7–7.7)
PLATELETS: 251 10*3/uL (ref 150–400)
RBC: 4.29 MIL/uL (ref 3.87–5.11)
RDW: 13.5 % (ref 11.5–15.5)
WBC: 6.5 10*3/uL (ref 4.0–10.5)

## 2013-05-30 LAB — COMPREHENSIVE METABOLIC PANEL
ALK PHOS: 83 U/L (ref 39–117)
ALT: 18 U/L (ref 0–35)
AST: 17 U/L (ref 0–37)
Albumin: 3.8 g/dL (ref 3.5–5.2)
BILIRUBIN TOTAL: 0.4 mg/dL (ref 0.3–1.2)
BUN: 13 mg/dL (ref 6–23)
CHLORIDE: 102 meq/L (ref 96–112)
CO2: 25 meq/L (ref 19–32)
CREATININE: 0.93 mg/dL (ref 0.50–1.10)
Calcium: 9.4 mg/dL (ref 8.4–10.5)
GFR calc Af Amer: 80 mL/min — ABNORMAL LOW (ref >90)
GFR, EST NON AFRICAN AMERICAN: 69 mL/min — AB (ref >90)
Glucose, Bld: 102 mg/dL — ABNORMAL HIGH (ref 70–99)
POTASSIUM: 3.8 meq/L (ref 3.7–5.3)
Sodium: 140 mEq/L (ref 137–147)
Total Protein: 7.8 g/dL (ref 6.0–8.3)

## 2013-05-30 LAB — ACETAMINOPHEN LEVEL

## 2013-05-30 MED ORDER — NAPROXEN 250 MG PO TABS
500.0000 mg | ORAL_TABLET | Freq: Once | ORAL | Status: AC
  Administered 2013-05-30: 500 mg via ORAL
  Filled 2013-05-30: qty 2

## 2013-05-30 MED ORDER — HYDROCHLOROTHIAZIDE 12.5 MG PO TABS: 12.5000 mg | ORAL_TABLET | Freq: Every day | ORAL | Status: DC

## 2013-05-30 MED ORDER — LISINOPRIL 10 MG PO TABS: 10.0000 mg | ORAL_TABLET | Freq: Every day | ORAL | Status: DC

## 2013-05-30 MED ORDER — LISINOPRIL 10 MG PO TABS
10.0000 mg | ORAL_TABLET | Freq: Once | ORAL | Status: AC
  Administered 2013-05-30: 10 mg via ORAL
  Filled 2013-05-30: qty 1

## 2013-05-30 MED ORDER — HYDROCHLOROTHIAZIDE 25 MG PO TABS
25.0000 mg | ORAL_TABLET | Freq: Once | ORAL | Status: AC
  Administered 2013-05-30: 25 mg via ORAL
  Filled 2013-05-30: qty 1

## 2013-05-30 NOTE — ED Notes (Signed)
MD at bedside. 
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
## 2013-05-30 NOTE — ED Notes (Addendum)
Pt c/o B/L leg pain with numbness ongoing for awhile. 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 Pt BP elevated in triage reports that she has not taken her BP meds x 1 year. Pt reports taking about 100 tylenol this week and tried "mustard packs"

## 2013-05-30 NOTE — Discharge Instructions (Signed)
Take the blood pressure medication as I have prescribed. Return to see her doctor in 2 weeks. If her symptoms worsen return to the hospital.  Naprosyn for back pain  Lisinopril and hydrochlorothiazide for hypertension. Please read the attachments regarding the side effects of these medications.  Please call your doctor for a followup appointment within 24-48 hours. When you talk to your doctor please let them know that you were seen in the emergency department and have them acquire all of your records so that they can discuss the findings with you and formulate a treatment plan to fully care for your new and ongoing problems.

## 2013-05-30 NOTE — ED Notes (Signed)
Pt states pain and numbness to bilateral legs radiating to back. Ongoing for several weeks. 8/10 pain at the time. Able to move all extremities without difficulty. Pt is alert and oriented x4. NAD.

## 2013-05-30 NOTE — ED Notes (Signed)
Introduced myself to patient, she is comfortable and resting.

## 2013-06-02 ENCOUNTER — Ambulatory Visit (INDEPENDENT_AMBULATORY_CARE_PROVIDER_SITE_OTHER): Payer: BC Managed Care – PPO | Admitting: Family Medicine

## 2013-06-02 ENCOUNTER — Encounter: Payer: Self-pay | Admitting: Family Medicine

## 2013-06-02 VITALS — BP 184/100 | HR 71 | Ht 64.0 in | Wt 143.0 lb

## 2013-06-02 DIAGNOSIS — M79609 Pain in unspecified limb: Secondary | ICD-10-CM

## 2013-06-02 DIAGNOSIS — I10 Essential (primary) hypertension: Secondary | ICD-10-CM

## 2013-06-02 DIAGNOSIS — M79604 Pain in right leg: Secondary | ICD-10-CM

## 2013-06-02 DIAGNOSIS — M79605 Pain in left leg: Secondary | ICD-10-CM

## 2013-06-02 LAB — BASIC METABOLIC PANEL
BUN: 16 mg/dL (ref 6–23)
CALCIUM: 9.8 mg/dL (ref 8.4–10.5)
CO2: 27 mEq/L (ref 19–32)
Chloride: 102 mEq/L (ref 96–112)
Creat: 0.96 mg/dL (ref 0.50–1.10)
Glucose, Bld: 99 mg/dL (ref 70–99)
POTASSIUM: 4.4 meq/L (ref 3.5–5.3)
SODIUM: 140 meq/L (ref 135–145)

## 2013-06-02 LAB — MAGNESIUM: MAGNESIUM: 2.3 mg/dL (ref 1.5–2.5)

## 2013-06-02 MED ORDER — HYDROCHLOROTHIAZIDE 12.5 MG PO TABS: 25.0000 mg | ORAL_TABLET | Freq: Every day | ORAL | Status: DC

## 2013-06-02 MED ORDER — GABAPENTIN 100 MG PO CAPS: 100.0000 mg | ORAL_CAPSULE | Freq: Three times a day (TID) | ORAL | Status: DC

## 2013-06-02 NOTE — Patient Instructions (Addendum)
Elevated Blood Pressure - increase HCTZ to 25 mg daily (take 2 of the 12.5 tablets), continue Lisinopril 10 mg daily, check lab work today, schedule follow up appointment tomorrow for recheck of blood pressure. If you develop headaches/vision changes/chest pain please go to the emergency room immediately.   Leg cramping - check electrolytes including Magnesium, start gabapentin 100 mg three times a day (this medication can cause sleepiness). Attempt trial of heat and massage.
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
## 2013-06-03 ENCOUNTER — Encounter: Payer: Self-pay | Admitting: Family Medicine

## 2013-06-03 NOTE — Assessment & Plan Note (Signed)
Currently uncontrolled without evidence of end organ damage. Will repeat BMP to evaluate kidney function. No acute neurologic symptoms. No current chest pain. -Increase HCTZ to 25 mg daily -Continue lisinopril 10 mg daily -Return in one week for blood pressure check -Return precautions provided including acute neurologic symptoms or new onset chest pain

## 2013-06-03 NOTE — Assessment & Plan Note (Signed)
Patient reports bilateral leg pain of uncertain etiology. Patient does describe her pain as a "wringing" sensation which may be consistent with cramping. Neurologic examination is unremarkable would suggest against radicular pathology. -Attempt trial of heat to affected areas -Check electrolytes including magnesium to rule out electrolyte abnormality -Initiated therapy with gabapentin to cover potential neuropathic pain -No imaging is warrented at this time however if symptoms persist could consider further evaluation of the lumbar spine

## 2013-06-22 ENCOUNTER — Encounter: Payer: Self-pay | Admitting: Family Medicine

## 2013-06-22 ENCOUNTER — Ambulatory Visit (INDEPENDENT_AMBULATORY_CARE_PROVIDER_SITE_OTHER): Payer: BC Managed Care – PPO | Admitting: Family Medicine

## 2013-06-22 VITALS — BP 153/102 | HR 78 | Temp 98.6°F | Ht 64.0 in | Wt 145.9 lb

## 2013-06-22 DIAGNOSIS — M545 Low back pain, unspecified: Secondary | ICD-10-CM

## 2013-06-22 DIAGNOSIS — I10 Essential (primary) hypertension: Secondary | ICD-10-CM

## 2013-06-22 LAB — LIPID PANEL
CHOL/HDL RATIO: 3.3 ratio
CHOLESTEROL: 217 mg/dL — AB (ref 0–200)
HDL: 65 mg/dL (ref >39)
LDL Cholesterol: 134 mg/dL — ABNORMAL HIGH (ref 0–99)
TRIGLYCERIDES: 91 mg/dL (ref <150)
VLDL: 18 mg/dL (ref 0–40)

## 2013-06-22 LAB — POCT GLYCOSYLATED HEMOGLOBIN (HGB A1C): HEMOGLOBIN A1C: 5

## 2013-06-22 LAB — BASIC METABOLIC PANEL
BUN: 12 mg/dL (ref 6–23)
CALCIUM: 9.8 mg/dL (ref 8.4–10.5)
CO2: 25 meq/L (ref 19–32)
Chloride: 104 mEq/L (ref 96–112)
Creat: 0.95 mg/dL (ref 0.50–1.10)
GLUCOSE: 87 mg/dL (ref 70–99)
Potassium: 4.6 mEq/L (ref 3.5–5.3)
Sodium: 139 mEq/L (ref 135–145)

## 2013-06-22 MED ORDER — CYCLOBENZAPRINE HCL 10 MG PO TABS: 10.0000 mg | ORAL_TABLET | Freq: Three times a day (TID) | ORAL | Status: DC | PRN

## 2013-06-22 MED ORDER — TRAMADOL HCL 50 MG PO TABS: 50.0000 mg | ORAL_TABLET | Freq: Three times a day (TID) | ORAL | Status: DC | PRN

## 2013-06-22 MED ORDER — HYDROCHLOROTHIAZIDE 25 MG PO TABS: 25.0000 mg | ORAL_TABLET | Freq: Every day | ORAL | Status: DC

## 2013-06-22 NOTE — Patient Instructions (Signed)
For the blood pressure, try and check it at the drugstore and bring me back the record.   For the back pain, if it doesn't get better, let me know and we will get to the next step.   Follow up in 2 weeks.

## 2013-06-22 NOTE — Progress Notes (Signed)
Patient ID: Elaine [name]    DOB: 1960-04-22, 53 y.o.   MRN: [id number] --- Subjective:  Elaine [name] is a 53 y.o.female who presents for follow up on BP and right lower back pain.  - BP: was recently started on lisinopril 10 and hctz 25mg  since early May. She ran out of hctz 3 days ago but has been taking lisinopril. She denies any chest pain, sob, headache, double vision or lower extr swelling. She doesn't check her BP.   - low back pain and radiation to right leg: present for over a year. Associated with numbness and tingling in lower extremities. Worst with walking and some paying with laying down. Nothing makes it better. Was started on gabapentin which did not help. No weakness in lower extr. No loss of urine ir bowel.   ROS: see HPI Past Medical History: reviewed and updated medications and allergies. Social History: Tobacco: current smoker: 1 pack per day  Objective: Filed Vitals:   06/22/13 1403  BP: 153/102  Pulse: 78  Temp: 98.6 F (37 C)    Physical Examination:   General appearance - alert, well appearing, in no distress and very pleasant.  Neck - supple, no significant adenopathy Chest - clear to auscultation, no wheezes, rales or rhonchi, symmetric air entry Heart - normal rate, regular rhythm, normal S1, S2, no murmurs Back - tenderness to palpation along l spine and paralumbar muscles bilaterally wit increased muscle spasm on right, no tenderness along lateral thigh. Negative straight leg bilaterally, normals ensation to light touch bilaterally, normal strength with knee extension, flexion bilaterally, plantarflexion and dorsiflexion, trace patellar reflex present bilaterally Extremities - no edema and DP pulses strong bilaterally

## 2013-06-24 NOTE — Assessment & Plan Note (Addendum)
Could be component of spinal stenosis or sciatica.  Check plain L spine film.  Trial of flexeril and tramadol. If not improved, consider referral for spinal injection.

## 2013-06-24 NOTE — Assessment & Plan Note (Addendum)
Still not controlled but better than prior.  On lisinopril currently. Patient to restart hctz today. Sent refill to pharmacy.  Will check BMP since she has been taking lisinopril for about 2 weeks.  Also check lipid panel and A1c.  F/u in 2 weeks.

## 2013-07-07 ENCOUNTER — Ambulatory Visit
Admission: RE | Admit: 2013-07-07 | Discharge: 2013-07-07 | Disposition: A | Discharge status: Home / Self Care | Payer: BC Managed Care – PPO | Source: Ambulatory Visit | Attending: Family Medicine | Admitting: Family Medicine

## 2013-07-07 DIAGNOSIS — M545 Low back pain, unspecified: Secondary | ICD-10-CM

## 2013-07-09 ENCOUNTER — Ambulatory Visit (INDEPENDENT_AMBULATORY_CARE_PROVIDER_SITE_OTHER): Payer: BC Managed Care – PPO | Admitting: Family Medicine

## 2013-07-09 ENCOUNTER — Encounter: Payer: Self-pay | Admitting: Family Medicine

## 2013-07-09 ENCOUNTER — Telehealth: Payer: Self-pay | Admitting: *Deleted

## 2013-07-09 VITALS — BP 150/98 | HR 71 | Ht 64.0 in | Wt 141.0 lb

## 2013-07-09 DIAGNOSIS — M545 Low back pain, unspecified: Secondary | ICD-10-CM

## 2013-07-09 DIAGNOSIS — M5430 Sciatica, unspecified side: Secondary | ICD-10-CM

## 2013-07-09 DIAGNOSIS — E785 Hyperlipidemia, unspecified: Secondary | ICD-10-CM

## 2013-07-09 DIAGNOSIS — I10 Essential (primary) hypertension: Secondary | ICD-10-CM

## 2013-07-09 DIAGNOSIS — M5442 Lumbago with sciatica, left side: Secondary | ICD-10-CM

## 2013-07-09 MED ORDER — LOSARTAN POTASSIUM 50 MG PO TABS: 50.0000 mg | ORAL_TABLET | Freq: Every day | ORAL | Status: DC

## 2013-07-09 MED ORDER — ROSUVASTATIN CALCIUM 10 MG PO TABS: 10.0000 mg | ORAL_TABLET | Freq: Every day | ORAL | Status: DC

## 2013-07-09 NOTE — Patient Instructions (Signed)
Please make an appointment in 1 week with the pharmacy clinic for 24hr blood pressure monitoring followed by a consultation with them.   Start the losartan and stop the lisinopril.  For the cholesterol, start the crestor. If it causes muscle pains, let us know and start taking it every other day.

## 2013-07-09 NOTE — Assessment & Plan Note (Addendum)
L spine xray showing Grade 1-2 anterolisthesis of L5 on S1 that is likely degenerative in origin. Also mild to moderate loss of disc height at this level.  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

## 2013-07-09 NOTE — Telephone Encounter (Signed)
Called and informed patient of appt for MRI lumber spine at GI on 07/16/13 at 11:45 am.Busick, Rodena Medinobert Lee

## 2013-07-09 NOTE — Progress Notes (Signed)
Patient ID: Elaine LewisRosalyn [name]    DOB: 04/16/1960, 53 y.o.   MRN: [id number] --- Subjective:  Elaine SeminoleRosalyn is a 53 y.o.female who presents for follow up on right leg pain and HTN.   # Hypertension: Medications: lisinopril 10, hctz 25mg  daily Number of doses missed in 1 week: 0 Side effect from meds: started having dry cough after starting lisinopril BP at home: 153-135/102-80 Exercise routine: limited Salt in diet: reduced Chest pain: none Lower extremity swelling: none Shortness of breath: none Headache: none Change in vision: none   # Right leg pain: unchanged from prior. Ongoing for over 1 year. Associated with numbness and tingling in right foot. Pain shoots from low back down leg past the knee. Nothing makes it better. Nothing makes it worst. Flexeril did not hlp. Tramadol did not help either.  ROS: see HPI Past Medical History: reviewed and updated medications and allergies. Social History: Tobacco:current smoker  Objective: Filed Vitals:   07/09/13 1112  BP: 178/114  Pulse: 71   Manual Repeat: 150/98  Physical Examination:   General appearance - alert, well appearing, and in no distress Chest - clear to auscultation, no wheezes, rales or rhonchi, symmetric air entry Heart - normal rate, regular rhythm, normal S1, S2, no murmurs Extr: no edema MSK - no tenderness to palpation along lumbar spine or paralumbar muscles, 4+/5 strength with extention of right knee compared to 5/5 on left. 5/5 strength with knee flxexion, foot dorsiflexion, foot plantarflexion and hip flexion bilaterally.

## 2013-07-11 DIAGNOSIS — E785 Hyperlipidemia, unspecified: Secondary | ICD-10-CM | POA: Insufficient documentation

## 2013-07-11 NOTE — Assessment & Plan Note (Signed)
Not at goal per ASCVD risk assessment.  Start crestor 10. Given patient's history of leg pain, do not want to start statin at too high dose in case of possible myalgias.

## 2013-07-11 NOTE — Assessment & Plan Note (Addendum)
Manual repeat better than initial BP. Currently 150/98 Will change lisinopril to losartan since patient complains of cough.  Patient to make appointment for 24hr BP monitoring for next week with appointment with pharmacy clinic for BP eval and BMP since patient would have started losartan 1 week prior.

## 2013-07-16 ENCOUNTER — Other Ambulatory Visit: Payer: BC Managed Care – PPO

## 2013-07-16 ENCOUNTER — Ambulatory Visit: Payer: BC Managed Care – PPO | Admitting: Pharmacist

## 2013-07-18 ENCOUNTER — Ambulatory Visit
Admission: RE | Admit: 2013-07-18 | Discharge: 2013-07-18 | Disposition: A | Discharge status: Home / Self Care | Payer: BC Managed Care – PPO | Source: Ambulatory Visit | Attending: Family Medicine | Admitting: Family Medicine

## 2013-07-18 DIAGNOSIS — M5442 Lumbago with sciatica, left side: Secondary | ICD-10-CM

## 2013-07-20 ENCOUNTER — Telehealth: Payer: Self-pay | Admitting: Family Medicine

## 2013-07-20 NOTE — Telephone Encounter (Signed)
Patient would like the results of her MRI not her labs, will forward to PCP to review MRI results.Busick, Rodena Medinobert Lee

## 2013-07-20 NOTE — Telephone Encounter (Signed)
Please call Ms. Amsden back regarding lab results

## 2013-07-21 ENCOUNTER — Encounter: Payer: Self-pay | Admitting: Family Medicine

## 2013-07-21 ENCOUNTER — Telehealth: Payer: Self-pay | Admitting: Family Medicine

## 2013-07-21 MED ORDER — ATORVASTATIN CALCIUM 20 MG PO TABS: 20.0000 mg | ORAL_TABLET | Freq: Every day | ORAL | Status: DC

## 2013-07-21 NOTE — Telephone Encounter (Signed)
With insurance her cholestrol medication is $95. Is there something cheaper?

## 2013-07-21 NOTE — Telephone Encounter (Signed)
Please let patient know that the MRI of her lower back showed that she has degenerative changes consistent with osteoarthritis. She also has spinal stenosis where there is narrowing of the spinal canal at L4 level. She also has disc bulging at L3-L4. These changes could very well be explaining her pain.  The plan will be for her to be seen by ortho for discussion of options for treatment which may include spinal injection. They can also review the MRI with her.  I will send her the MRI report so that she can bring it to the ortho appointment.   Thank you!  Elaine ChancyStephanie [name], PGY-3 Family Medicine Resident

## 2013-07-21 NOTE — Telephone Encounter (Signed)
I will change it to lipitor in hopes that it will be cheaper for her.  Also, Please let patient know that the MRI of her lower back showed that she has degenerative changes consistent with osteoarthritis. She also has spinal stenosis where there is narrowing of the spinal canal at L4 level. She also has disc bulging at L3-L4. These changes could very well be explaining her pain.  The plan will be for her to be seen by ortho for discussion of options for treatment which may include spinal injection. They can also review the MRI with her.  I will send her the MRI report so that she can bring it to the ortho appointment.  Thank you!  Elaine ChancyStephanie Losq, PGY-3  Family Medicine Resident

## 2013-07-21 NOTE — Telephone Encounter (Signed)
Please advise.Thank you.[name], Giovanna S  

## 2013-07-21 NOTE — Telephone Encounter (Signed)
Left message for a return call.[name], Elaine [name]  

## 2013-07-21 NOTE — Telephone Encounter (Signed)
Duplicate phone note.

## 2013-07-23 NOTE — Telephone Encounter (Signed)
Left message on vm to return call, will await callback from patient.

## 2013-09-23 ENCOUNTER — Encounter (HOSPITAL_COMMUNITY): Payer: Self-pay | Admitting: Emergency Medicine

## 2013-09-23 ENCOUNTER — Emergency Department (HOSPITAL_COMMUNITY)
Admission: EM | Admit: 2013-09-23 | Discharge: 2013-09-23 | Discharge status: Against Medical Advice | Payer: BC Managed Care – PPO | Attending: Emergency Medicine | Admitting: Emergency Medicine

## 2013-09-23 DIAGNOSIS — Z7982 Long term (current) use of aspirin: Secondary | ICD-10-CM | POA: Insufficient documentation

## 2013-09-23 DIAGNOSIS — Z8659 Personal history of other mental and behavioral disorders: Secondary | ICD-10-CM | POA: Insufficient documentation

## 2013-09-23 DIAGNOSIS — R21 Rash and other nonspecific skin eruption: Secondary | ICD-10-CM | POA: Diagnosis present

## 2013-09-23 DIAGNOSIS — F17200 Nicotine dependence, unspecified, uncomplicated: Secondary | ICD-10-CM | POA: Insufficient documentation

## 2013-09-23 DIAGNOSIS — Z79899 Other long term (current) drug therapy: Secondary | ICD-10-CM | POA: Diagnosis not present

## 2013-09-23 DIAGNOSIS — I10 Essential (primary) hypertension: Secondary | ICD-10-CM | POA: Diagnosis not present

## 2013-09-23 DIAGNOSIS — M129 Arthropathy, unspecified: Secondary | ICD-10-CM | POA: Insufficient documentation

## 2013-09-23 DIAGNOSIS — B379 Candidiasis, unspecified: Secondary | ICD-10-CM
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
## 2013-09-23 NOTE — ED Provider Notes (Signed)
Medical screening examination/treatment/procedure(s) were performed by non-physician practitioner and as supervising physician I was immediately available for consultation/collaboration.   EKG Interpretation None        Kamaiya Antilla, MD 09/23/13 1606 

## 2013-09-23 NOTE — ED Notes (Signed)
Pt states that she was seen by her doctor yesterday and dx with a fungal infection, which is generalized on her extremities and face. Pt states she was given pills and cream but they "have not helped."

## 2013-09-23 NOTE — ED Notes (Signed)
Patient left AMA.  She stated that "coming here was a waste of her time" Patient was seen for same issue yesterday at [facility] and was informed  That she needed to give the medication more time to work

## 2013-10-08 ENCOUNTER — Encounter: Payer: Self-pay | Admitting: Family Medicine

## 2013-10-08 ENCOUNTER — Ambulatory Visit (INDEPENDENT_AMBULATORY_CARE_PROVIDER_SITE_OTHER): Payer: BC Managed Care – PPO | Admitting: Family Medicine

## 2013-10-08 VITALS — BP 165/99 | HR 75 | Temp 98.1°F | Wt 136.2 lb

## 2013-10-08 DIAGNOSIS — M79604 Pain in right leg: Secondary | ICD-10-CM

## 2013-10-08 DIAGNOSIS — I10 Essential (primary) hypertension: Secondary | ICD-10-CM

## 2013-10-08 DIAGNOSIS — M5430 Sciatica, unspecified side: Secondary | ICD-10-CM

## 2013-10-08 DIAGNOSIS — M79605 Pain in left leg: Principal | ICD-10-CM

## 2013-10-08 DIAGNOSIS — M79609 Pain in unspecified limb: Secondary | ICD-10-CM

## 2013-10-08 DIAGNOSIS — M5441 Lumbago with sciatica, right side: Secondary | ICD-10-CM

## 2013-10-08 MED ORDER — HYDROCHLOROTHIAZIDE 25 MG PO TABS: 25.0000 mg | ORAL_TABLET | Freq: Every day | ORAL | Status: DC

## 2013-10-08 MED ORDER — AMLODIPINE BESYLATE 10 MG PO TABS: 10.0000 mg | ORAL_TABLET | Freq: Every day | ORAL | Status: DC

## 2013-10-08 MED ORDER — METHYLPREDNISOLONE (PAK) 4 MG PO TABS: ORAL_TABLET | ORAL | Status: DC

## 2013-10-08 MED ORDER — GABAPENTIN 100 MG PO CAPS: 100.0000 mg | ORAL_CAPSULE | Freq: Three times a day (TID) | ORAL | Status: DC

## 2013-10-08 NOTE — Assessment & Plan Note (Signed)
Mid-Right SI low back pain with associated bilateral R > L radiating pain down legs consistent with suspected radicular pain - MRI 06/2013 confirmed significant L5-S1 degenerative disc disease, foraminal, and spinal stenosis - Previously referred to Ortho for eval and consider epidural injection (declined)  Plan: 1. Medrol Dosepak for reducing nerve irritation 2. Repeat trial Gabapentin  TID (titrate up) 3. Advised continue Tylenol / NSAIDs, relative rest, heating pad, stretching / ROM 4. Recommended that pt schedule follow-up with Ortho for discussion of spinal/facet injection, now with MRI seems like would beneficial 5. Will plan to refer to PT if no significant improvement 6. RTC in 1 month

## 2013-10-08 NOTE — Patient Instructions (Signed)
Dear Elaine [name], Thank you for coming in to clinic today.  Today we discussed your Hip / Back / Leg Pain and High Blood Pressure. 1. Your MRI shows some severe arthritis / degenerative disease in your lower spine, this is most likely causing your hip and leg pain. 2. To help reduce your pain I have sent 2 medicines. Medrol Dosepak is a short steroid course to reduce inflammation. Follow instructions on the package for how to take this. 3. Sent Gabapentin - take a  capsule nightly for 3 nights, then take one in morning and night for 3 days, then start taking 1 capsule 3 times a day, this may take some time to be effective. Continue this for 1 month, and we will re-evaluate. 4. Recommend regular stretching and staying active. Avoid over activity 5. May try heating pad for lower back. 6. Recommend returning to Orthopedic doctors for follow-up, and re-evaluation. I encourage you to pursue the epidural injection as the MRI shows that this is likely where the problem is coming from. 7. Started Amlodipine - take 1 tablet daily.  Please schedule a follow-up appointment with me (Dr. Althea Charon) in 1 to 2 months for follow-up.  If you have any other questions or concerns, please feel free to call the clinic to contact me. You may also schedule an earlier appointment if necessary.  However, if your symptoms get significantly worse, please go to the Emergency Department to seek immediate medical attention.  Saralyn Pilar, DO [facility]

## 2013-10-08 NOTE — Assessment & Plan Note (Signed)
Poorly controlled HTN, recently out of HCTZ  No complications   Plan:  1. Start Amlodipine  daily, refilled HCTZ  daily, Losartan  daily  2. Lifestyle Mods - Smoking cessation, Exercise, Dec salt intake, inc K+ rich vegs 3. Advised to monitor BP at drug store occasionally.

## 2013-10-08 NOTE — Progress Notes (Signed)
   Subjective:    Patient ID: Elaine [name], female    DOB: Dec 19, 1960, 53 y.o.   MRN: [id number]  HPI  Right Hip / Leg Pain: - Reports history of leg pain for >1 year, constant now with daily pain but intermittent worsening, "grabbing / penetrating pain" from right lower hip / sacral region radiating down back of leg, worse with prolonged standing and activity, improved with sitting and rest - Previously referred to Orthopedics, received hip steroid injection without improvement, offered spinal epidural injection, patient declined - Tried Tylenol, Goody powder, Advil, Tramadol, Flexeril (without relief) - Never had PT or Surgery - Recent imaging -Lumbar X-ray (07/07/13) Grade 1-2 anterolisthesis, likely degeneration, mild-mod loss disc height. Lumbar MRI (07/18/13): Grade 1 anterolisthesis L5-S1, severe facet degeneration, multifactorial mod-severe L4 biforaminal stenosis w/ mild spinal stenosis - Admits to some tingling in leg - Denies any fevers/chills, numbness, weakness, urinary retention or incontinence  CHRONIC HTN: Reports - out of HCTZ daily (was taking 2x daily), Losartan  daily. Requesting return to Amlodpine Previously tolerating meds well, w/o complaints. Denies CP, dyspnea, HA, edema, dizziness / lightheadedness  I have reviewed and updated the following as appropriate: allergies and current medications  Social Hx: - Active smoker 1ppd  Review of Systems  See above HPI    Objective:   Physical Exam  BP 165/99  Pulse 75  Temp(Src) 98.1 F (36.7 C) (Oral)  Wt 136 lb 3.2 oz (61.78 kg)  SpO2 94%  Gen - well-appearing, moderate discomfort due to Right hip/leg pain, otherwise NAD Heart - RRR, no murmurs heard MSK - Back spinous processes non-tender to palpation, +mild-mod tenderness over central and Right SI joint, seated SLR with Right reproducing pain with questionable radicular symptoms, Left negative. Ext - non-tender, no edema, peripheral pulses intact +2  b/l Skin - warm, dry Neuro - awake, alert, grossly non-focal, intact muscle strength 5/5 b/l including bilateral lower ext hips, knees, ankles, intact distal sensation to light touch, mostly normal gait, favoring left leg     Assessment & Plan:   See specific A&P problem list for details.

## 2013-11-23 ENCOUNTER — Encounter: Payer: Self-pay | Admitting: Family Medicine

## 2014-01-25 ENCOUNTER — Ambulatory Visit: Payer: BLUE CROSS/BLUE SHIELD | Admitting: Family Medicine

## 2014-02-03 ENCOUNTER — Ambulatory Visit (INDEPENDENT_AMBULATORY_CARE_PROVIDER_SITE_OTHER): Payer: Self-pay | Admitting: Family Medicine

## 2014-02-03 ENCOUNTER — Encounter: Payer: Self-pay | Admitting: Family Medicine

## 2014-02-03 VITALS — BP 174/107 | HR 67 | Temp 98.0°F | Ht 64.0 in | Wt 134.0 lb

## 2014-02-03 DIAGNOSIS — Z720 Tobacco use: Secondary | ICD-10-CM | POA: Insufficient documentation

## 2014-02-03 DIAGNOSIS — L282 Other prurigo: Secondary | ICD-10-CM

## 2014-02-03 DIAGNOSIS — I10 Essential (primary) hypertension: Secondary | ICD-10-CM

## 2014-02-03 MED ORDER — HYDROCHLOROTHIAZIDE 25 MG PO TABS: 25.0000 mg | ORAL_TABLET | Freq: Every day | ORAL | Status: DC

## 2014-02-03 MED ORDER — TRIAMCINOLONE ACETONIDE 0.1 % EX CREA: 1.0000 "application " | TOPICAL_CREAM | Freq: Two times a day (BID) | CUTANEOUS | Status: DC

## 2014-02-03 MED ORDER — LOSARTAN POTASSIUM 50 MG PO TABS: 50.0000 mg | ORAL_TABLET | Freq: Every day | ORAL | Status: DC

## 2014-02-03 NOTE — Assessment & Plan Note (Signed)
Consistent with eczema, previously treated for fungal infection w/o improvement  Plan: 1. Trial Kenalog cream BID x 1 month, then PRN 2. RTC for re-evaluation

## 2014-02-03 NOTE — Patient Instructions (Addendum)
Dear Elaine [name], Thank you for coming in to clinic today.  1. Your Blood Pressure remains significantly elevated. Looking at your record, it has been high overall for the past several years. Goal is < 140 / 80. - Today your BP was 174/107, re-checked at 160/95 - As discussed, it is important to take your BP meds every day - do not skip days or they will not be effective. - You can take all 3 meds in the morning at the same time - Refills sent to pharmacy - Continue to reduce salt intake, look at food labels for sodium content, eat more fresh vegetables - Try to stay active to help reduce your pressure 2. For your Rash, sent Kenalog cream, twice daily on leg for 1 month, then only as needed  Additionally, Tobacco and Alcohol can raise your BP. I'm pleased to hear that you've thought about quitting smoking, as this is the first step.  Please schedule a follow-up appointment with "Pharmacy Clinic - Dr. Raymondo BandKoval" for Ambulatory Blood Pressure Monitoring and Smoking Cessation (may make you schedule 2 different appointments). Try to be seen as soon as possible for the BP. After this visit, I will see you again within the next 2-4 weeks for BP follow-up.  Due for Mammogram screening (handout given)  If you have any other questions or concerns, please feel free to call the clinic to contact me. You may also schedule an earlier appointment if necessary.  However, if your symptoms get significantly worse, please go to the Emergency Department to seek immediate medical attention.  Elaine PilarAlexander Karamalegos, DO [facility]
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
## 2014-02-03 NOTE — Assessment & Plan Note (Signed)
Chronic active tobacco abuse, >10 years - Contemplation stage, considering quitting but no plan and no prior quit attempts  Plan: 1. Advised to schedule apt with Pharmacy Clinic Dr. Raymondo BandKoval for Smoking Cessation 2. F/u when ready to discuss treatment options

## 2014-02-03 NOTE — Assessment & Plan Note (Signed)
Remains poorly controlled HTN, with admitted non-adherence to medical therapy. Other factors inc BP (tobacco abuse, chronic alcohol use, occasional marijuana, no regular exercise, poor dietary habits) No complications - Re-check BP manual improved, persistently elevated.  Plan: 1. Cont current meds - Amlodipine 10mg  daily, and refilled HCTZ 25mg  daily, Losartan 50mg  daily - no increase at this time given poor adherence 2. Lifestyle mods - emphasized importance of smoking cessation, recommend start walking exercise, dec salt intake 3. Advised to schedule close f/u with Pharmacy Clinic for Ambulatory BP monitoring within 1-2 weeks (additionally may schedule for Smoking Cessation) 4. RTC 2-4 weeks after Pharm Clinic visit for BP re-check. Consider increasing Losartan to 100mg  daily, adding 4th agent and further work-up for secondary causes HTN if no improvement on meds.

## 2014-03-12 ENCOUNTER — Encounter: Payer: Self-pay | Admitting: Family Medicine

## 2015-01-28 IMAGING — CR DG LUMBAR SPINE COMPLETE 4+V
5 series · 5 of 5 positions shown · non-contrast
Comparison: 02/16/2009

CLINICAL DATA: Low back and right leg pain

EXAM:
LUMBAR SPINE - COMPLETE 4+ VIEW

[t l-spine a.p.]
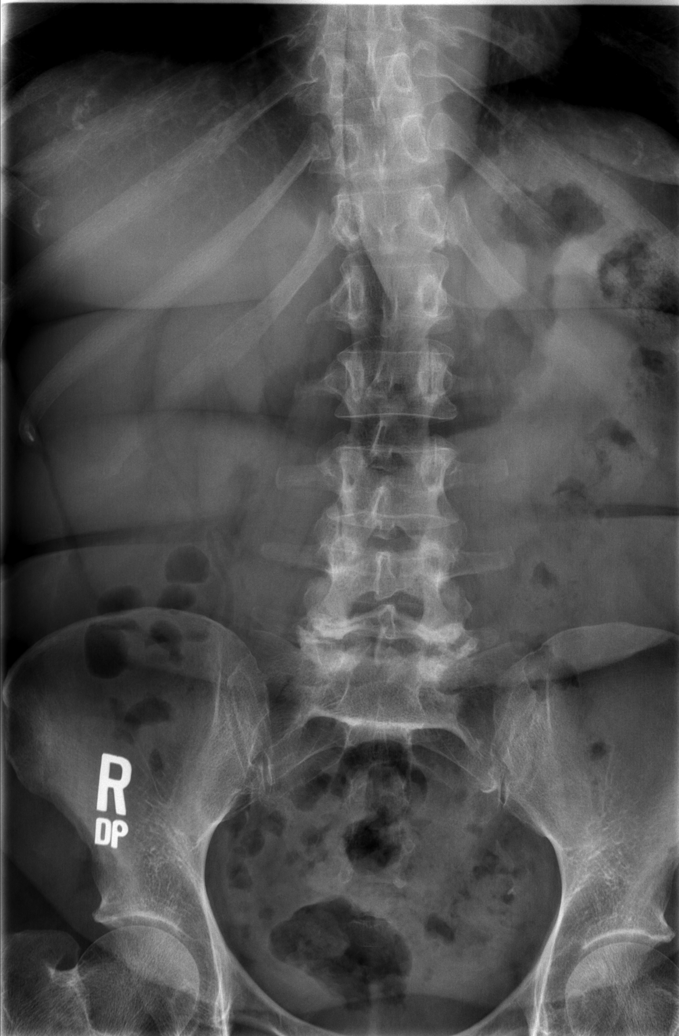

[t l-spine oblique exposure (1 of 2)]
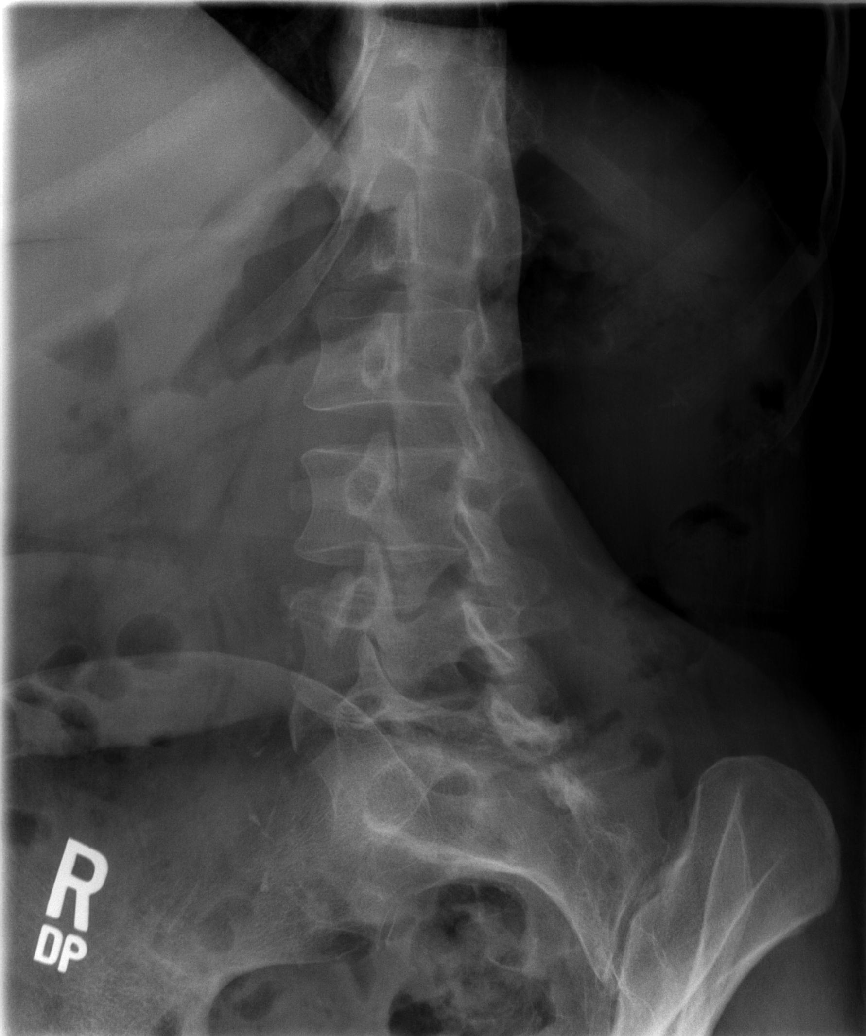

[t l-spine oblique exposure (2 of 2)]
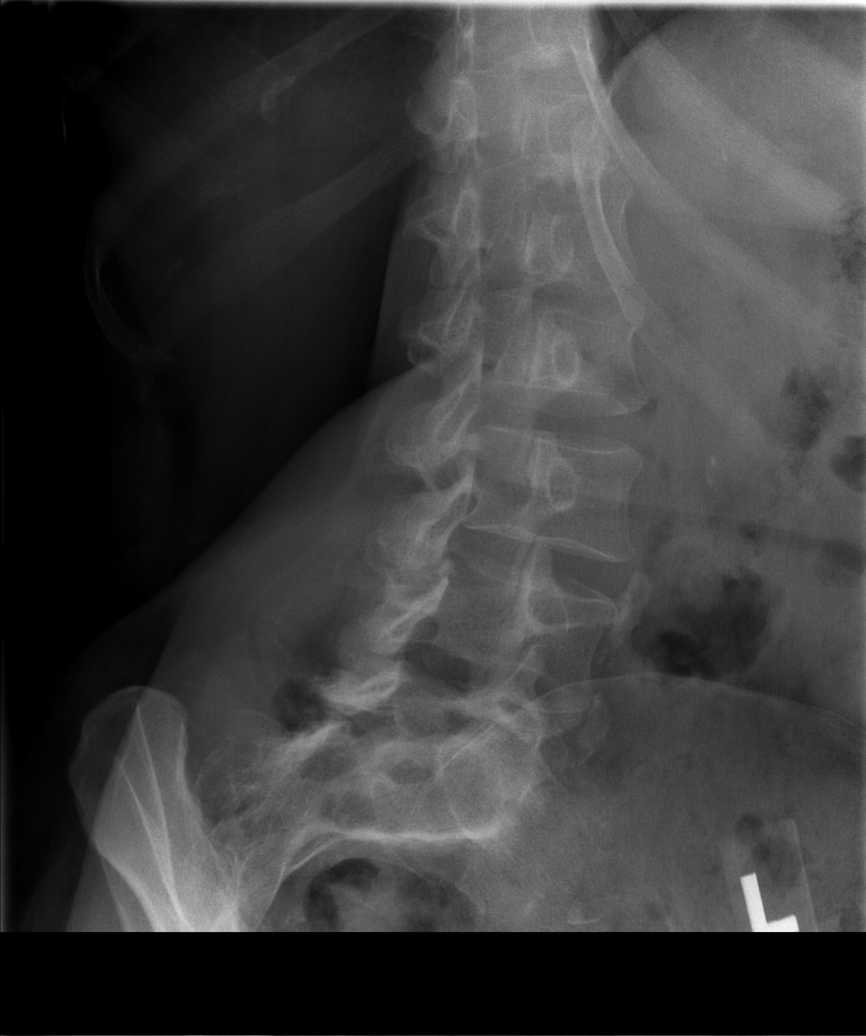

[t l-spine lat]
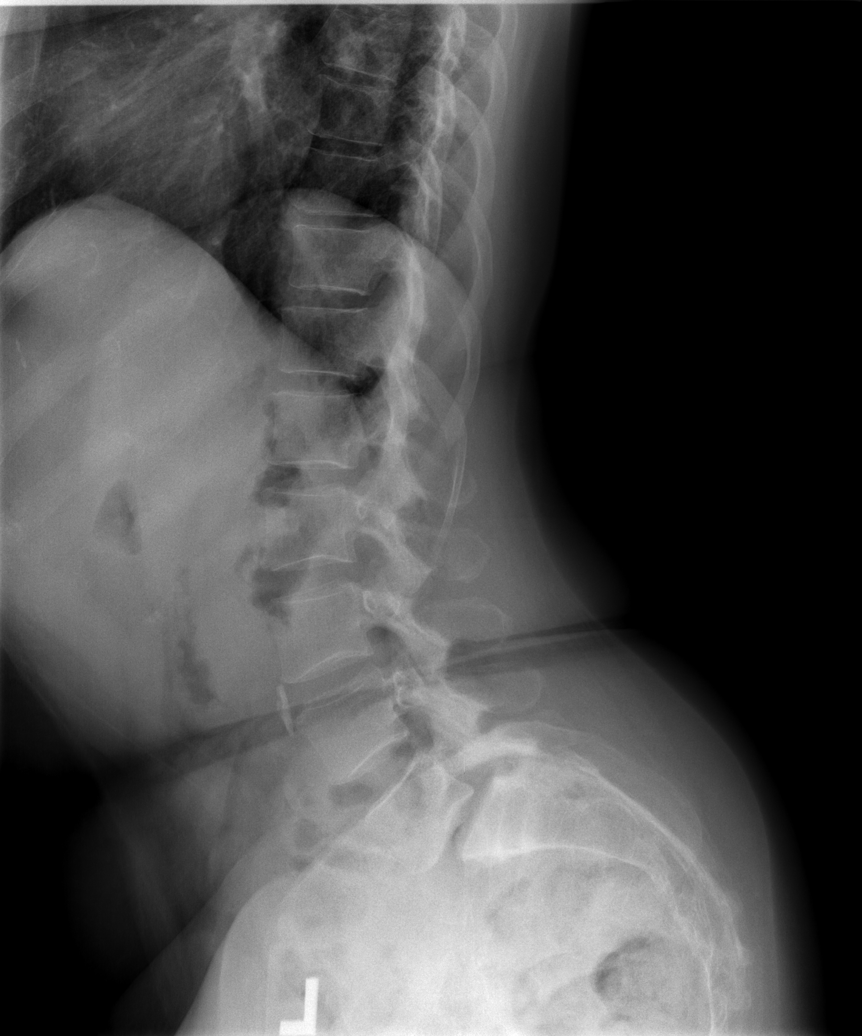

[t l-spine l5-s1 spot]
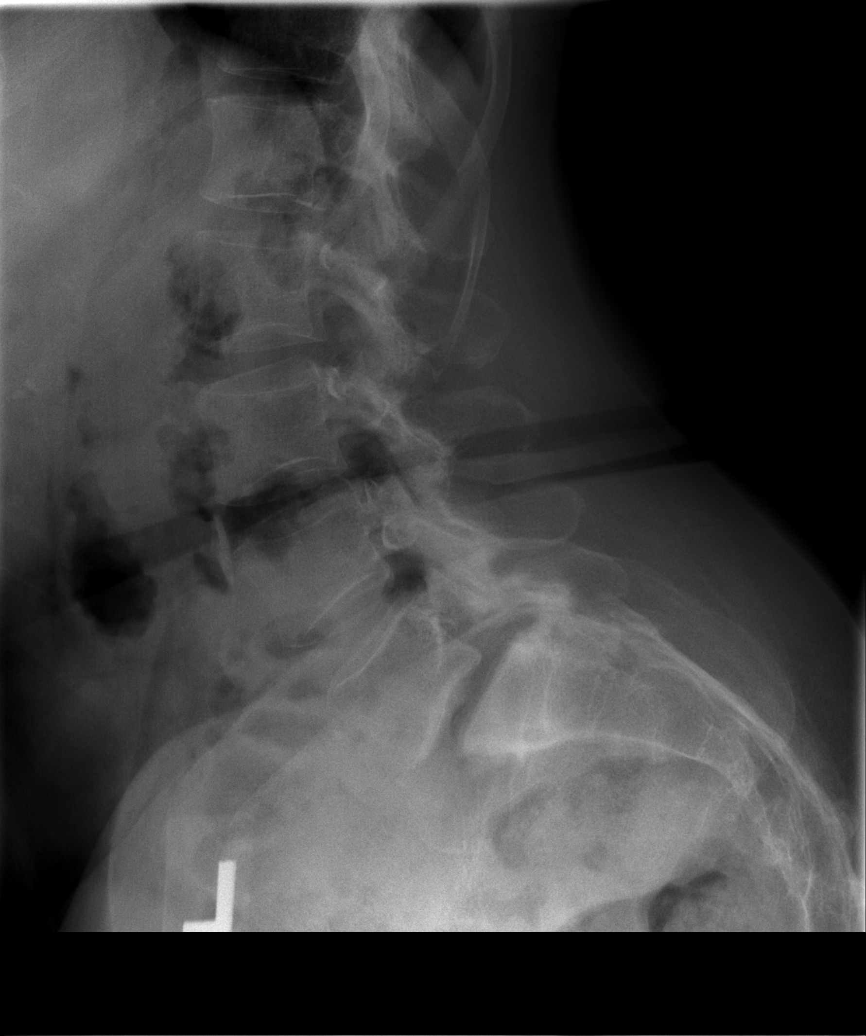

[5 of 5 positions shown; findings below may reference images not displayed]

FINDINGS: No fracture. There is a grade 1-2 anterolisthesis of L5 on S1, new
from the prior study. There is also mild to moderate loss of disc
height at this level. The spondylolisthesis appears degenerative.
There are no convincing pars defects, although this is not excluded.
There is mild facet joint narrowing at L5-S1.

Remaining facet joints and disc spaces are well preserved.

Soft tissues are unremarkable.
IMPRESSION: Grade 1-2 anterolisthesis of L5 on S1 that is likely degenerative in
origin, but could be due to pars defects that are not visualized on
the study. There is mild to moderate loss of disc height at this
level. These findings are new from the prior exam.

## 2015-06-14 ENCOUNTER — Emergency Department (HOSPITAL_COMMUNITY)
Admission: EM | Admit: 2015-06-14 | Discharge: 2015-06-14 | Disposition: A | Discharge status: Home / Self Care | Payer: Self-pay | Attending: Emergency Medicine | Admitting: Emergency Medicine

## 2015-06-14 ENCOUNTER — Encounter (HOSPITAL_COMMUNITY): Payer: Self-pay | Admitting: *Deleted

## 2015-06-14 DIAGNOSIS — I10 Essential (primary) hypertension: Secondary | ICD-10-CM | POA: Insufficient documentation

## 2015-06-14 DIAGNOSIS — Z8759 Personal history of other complications of pregnancy, childbirth and the puerperium: Secondary | ICD-10-CM | POA: Insufficient documentation

## 2015-06-14 DIAGNOSIS — Z79899 Other long term (current) drug therapy: Secondary | ICD-10-CM | POA: Insufficient documentation

## 2015-06-14 DIAGNOSIS — G8929 Other chronic pain: Secondary | ICD-10-CM | POA: Insufficient documentation

## 2015-06-14 DIAGNOSIS — F17210 Nicotine dependence, cigarettes, uncomplicated: Secondary | ICD-10-CM | POA: Insufficient documentation

## 2015-06-14 DIAGNOSIS — M5441 Lumbago with sciatica, right side: Secondary | ICD-10-CM | POA: Insufficient documentation

## 2015-06-14 DIAGNOSIS — M1990 Unspecified osteoarthritis, unspecified site: Secondary | ICD-10-CM | POA: Insufficient documentation

## 2015-06-14 DIAGNOSIS — L989 Disorder of the skin and subcutaneous tissue, unspecified: Secondary | ICD-10-CM | POA: Insufficient documentation

## 2015-06-14 MED ORDER — LIDOCAINE 5 % EX PTCH
1.0000 | MEDICATED_PATCH | CUTANEOUS | Status: DC
  Administered 2015-06-14: 1 via TRANSDERMAL
  Filled 2015-06-14: qty 1

## 2015-06-14 MED ORDER — HYDROXYZINE HCL 25 MG PO TABS: 25.0000 mg | ORAL_TABLET | Freq: Four times a day (QID) | ORAL | Status: DC

## 2015-06-14 MED ORDER — PREDNISONE 20 MG PO TABS
60.0000 mg | ORAL_TABLET | Freq: Once | ORAL | Status: AC
  Administered 2015-06-14: 60 mg via ORAL
  Filled 2015-06-14: qty 3

## 2015-06-14 MED ORDER — KETOROLAC TROMETHAMINE 60 MG/2ML IM SOLN
60.0000 mg | Freq: Once | INTRAMUSCULAR | Status: AC
  Administered 2015-06-14: 60 mg via INTRAMUSCULAR
  Filled 2015-06-14: qty 2

## 2015-06-14 MED ORDER — OXYCODONE-ACETAMINOPHEN 5-325 MG PO TABS
1.0000 | ORAL_TABLET | Freq: Once | ORAL | Status: AC
  Administered 2015-06-14: 1 via ORAL
  Filled 2015-06-14: qty 1

## 2015-06-14 MED ORDER — NAPROXEN 500 MG PO TABS: 500.0000 mg | ORAL_TABLET | Freq: Two times a day (BID) | ORAL | Status: DC

## 2015-06-14 MED ORDER — HYDROXYZINE HCL 25 MG PO TABS
25.0000 mg | ORAL_TABLET | Freq: Once | ORAL | Status: AC
  Administered 2015-06-14: 25 mg via ORAL
  Filled 2015-06-14: qty 1

## 2015-06-14 MED ORDER — LIDOCAINE 5 % EX PTCH: 1.0000 | MEDICATED_PATCH | CUTANEOUS | Status: DC

## 2015-06-14 MED ORDER — PREDNISONE 10 MG PO TABS: 20.0000 mg | ORAL_TABLET | Freq: Two times a day (BID) | ORAL | Status: DC
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
## 2015-06-14 NOTE — ED Notes (Signed)
Pt c/o right leg sciatica, fungus on right lower leg for two years.

## 2015-06-14 NOTE — Discharge Instructions (Signed)
You have been seen today for sciatica pain. It is recommended that you secure care with a primary care provider as soon as possible for chronic management of this issue and for evaluation of your itchy area on your lower leg. Hydroxyzine is for itching. Call the number provided to set up an appointment. Return to ED should symptoms worsen.  RESOURCE GUIDE  Chronic Pain Problems: Contact [facility]  [phone] Patients need to be referred by their primary care doctor.  Insufficient Money for Medicine: Contact United Way:  call "211" or [facility] [phone].  No Primary Care Doctor: - Call Health Connect  [phone] - can help you locate a primary care doctor that  accepts your insurance, provides certain services, etc. - Physician Referral Service- [phone]  Agencies that provide inexpensive medical care: - [facility]  [phone] - [facility]  [phone] - [facility]  [phone] - Women's Clinic  [phone] - [facility] Parenthood  [phone] [facility]  [phone]  Medicaid-accepting [facility] Providers: - [facility]- [address]  [phone], Mon-Fri 9am-7pm, Sat 9am-1pm - [facility]- [address] [place], [address] [place]  [phone] - [facility]- [address], [address] [place]  [phone] [facility]- [address]  [phone] - Renaye Rakers- [address] [place], [address], [phone]  Only accepts [place] patients after they have their name  applied to their card  Self Pay (no insurance) in [place]: - Sickle Cell Patients: Dr Willey Blade, [facility]  [address] [place], [phone] - [facility]- [address] [place]  [phone]       [facility] [place]- [address]       -     [facility]- see information  above (Speak to [employer] if you do not have insurance)       -  [facility]- [address] [place], [phone]       -  [facility]- [address],  [phone]       -  [facility]- [address], [phone]       -  Dr Julio Sicks-  [address], [place], [phone]       -  [facility]- [address], [phone]       -  [facility]- [address] [phone], also [address], [phone]       -    [facility]- [address] [place], [phone], 1st & 3rd Saturday   every month, 10am-1pm  1) Find a Doctor and Pay Out of Pocket Although you won't have to find out who is covered by your insurance plan, it is a good idea to ask around and get recommendations. You will then need to call the office and see if the doctor you have chosen will accept you as a new patient and what types of options they offer for patients who are self-pay. Some doctors offer discounts or will set up payment plans for their patients who do not have insurance, but you will need to ask so you aren't surprised when you get to your appointment.  2) Contact Your Local Health Department Not all health  departments have doctors that can see patients for sick visits, but many do, so it is worth a call to see if yours does. If you don't know where your local health department is, you can check in your phone book. The CDC also has a tool to help you locate your state's health department, and many state websites also have listings of all of their local health departments.  3) Find a Walk-in Clinic If your illness is not likely to be very severe or complicated, you may want to try a walk in clinic. These are popping up all over the country in pharmacies, drugstores, and shopping centers. They're usually staffed by nurse practitioners or physician assistants that have been trained to treat common illnesses and complaints. They're usually fairly quick and  inexpensive. However, if you have serious medical issues or chronic medical problems, these are probably not your best option  STD Testing - [facility] [place], STD Clinic, [address], [place], phone [phone] or [phone].  Monday - Friday, call for an appointment. [facility]- [place] [facility] of [facility] [place], STD Clinic, [place], [place], phone [phone] or [phone].  Monday - Friday, call for an appointment.  Abuse/Neglect: [facility]- [place] Child Abuse Hotline 252-848-3632(336) 434-737-8947 [facility]- [place] Child Abuse Hotline 308-233-2125480-730-1627 (After Hours)  Emergency Shelter:  [facility] (870)[phone](336) ([phone]  Maternity Homes: - [facility] at [facility] [phone](336) [phone] - [facility] ([phone](704) [phone]  MRSA Hotline #:   289-752-1745947-887-3072  [facility] [place] Resources  [facility]  United Way [facility] [address]                 [address]         [address]  [place]                                               [place]                              [place] Phone:  [phone]                                  Phone:  [phone]                   Phone:  [phone]  [facility], [phone] - [facility] - [phone]- [phone]       -     [facility] in [place], [address],                                  [phone] [phone], [facility]  [place] Child Abuse Hotline (979)652-2361(336) 8701241415 or 587-034-1174(336) 7756080795 (After Hours)   Behavioral Health Services  Substance Abuse Resources: - [facility] Drug [facility]  [phone] - [facility] [phone] - [facility] [phone] Floydene Flock- [facility] [phone] - Residential & Outpatient Substance Abuse Program  [phone]  Psychological Services: Tressie Ellis- [facility]  [phone] [facility]- [facility]  Services  [phone] [facility]- [place] [facility]  Health, [place] [address], [place], [place]: [phone] or [phone], PicCapture.uy  Dental Assistance  If unable to pay or uninsured, contact:  [facility] or [facility]. to become qualified for the adult dental clinic.  Patients with Medicaid: [facility] [phone] [address], [place] [address] [phone]  If unable to pay, or uninsured, contact [facility] [phone]) or [place] [phone] in [place], [place] in [facility]) to become qualified for the adult dental clinic   Other [place]- [place], [place], [place], [address], [place], [place], 2nd and 4th Thursday of the month at 6:30am.  10 clients each day by appointment, can sometimes see walk-in patients if someone does not show for an appointment. [facility]- [address] Hillard Danker [place], [place], [address], [place], [place], [place], [address], [place]- [employer] Department- [place] Department- [phone]

## 2015-11-21 ENCOUNTER — Ambulatory Visit: Payer: Self-pay | Attending: Internal Medicine

## 2015-11-23 ENCOUNTER — Encounter: Payer: Self-pay | Admitting: Internal Medicine

## 2015-11-23 ENCOUNTER — Ambulatory Visit (INDEPENDENT_AMBULATORY_CARE_PROVIDER_SITE_OTHER): Payer: Self-pay | Admitting: Internal Medicine

## 2015-11-23 DIAGNOSIS — I10 Essential (primary) hypertension: Secondary | ICD-10-CM

## 2015-11-23 DIAGNOSIS — M79605 Pain in left leg: Secondary | ICD-10-CM

## 2015-11-23 DIAGNOSIS — M79604 Pain in right leg: Secondary | ICD-10-CM

## 2015-11-23 LAB — BASIC METABOLIC PANEL WITH GFR
BUN: 21 mg/dL (ref 7–25)
CALCIUM: 9.6 mg/dL (ref 8.6–10.4)
CO2: 25 mmol/L (ref 20–31)
Chloride: 106 mmol/L (ref 98–110)
Creat: 1.41 mg/dL — ABNORMAL HIGH (ref 0.50–1.05)
GFR, EST NON AFRICAN AMERICAN: 42 mL/min — AB (ref >=60)
GFR, Est African American: 48 mL/min — ABNORMAL LOW (ref >=60)
Glucose, Bld: 89 mg/dL (ref 65–99)
Potassium: 4.8 mmol/L (ref 3.5–5.3)
Sodium: 141 mmol/L (ref 135–146)

## 2015-11-23 MED ORDER — HYDROCHLOROTHIAZIDE 25 MG PO TABS: 25.0000 mg | ORAL_TABLET | Freq: Every day | ORAL | 3 refills | Status: DC

## 2015-11-23 MED ORDER — MELOXICAM 7.5 MG PO TABS: 7.5000 mg | ORAL_TABLET | Freq: Every day | ORAL | 0 refills | Status: DC

## 2015-11-23 MED ORDER — GABAPENTIN 100 MG PO CAPS: 100.0000 mg | ORAL_CAPSULE | Freq: Three times a day (TID) | ORAL | 1 refills | Status: DC

## 2015-11-23 MED ORDER — AMLODIPINE BESYLATE 10 MG PO TABS: 10.0000 mg | ORAL_TABLET | Freq: Every day | ORAL | 3 refills | Status: DC

## 2015-11-23 NOTE — Assessment & Plan Note (Signed)
Uncontrolled, not taking any blood pressure medications due to lack insurance. Recently received the orange card  - restart norvasc and HCTZ  - follow up with PCP - consider restarting patient's losartan at that time  - check BMET today

## 2015-11-23 NOTE — Assessment & Plan Note (Addendum)
Complaint of chronic sciatic pain. MRI in 2015 not consistent with findings of severe changes. Has seen orthopedic in the past - without much relief. Physical exam not consistent with significant sciatic pain, given negative straight leg test .  - As last imaging from 2 years ago will get lumbar xray  - Mobic for 14 days  - Gabapentin 100 mg TID neuropathic pain - Follow up with PCP

## 2015-11-23 NOTE — Patient Instructions (Addendum)
I have refilled your records HCTZ and Norvasc you can take these medications for your blood pressure. I have also provided you with Mobic for your leg pain and gabapentin which he can take 3 times daily for the tingling that her having in your feet. [facility] to have a lumbar x-ray performed at your earliest convenience. Follow up with your primary care physician in 1 week.

## 2015-11-23 NOTE — Progress Notes (Signed)
   [name] Elaine CharsAsiyah Mikell, MD Phone: [phone]  Reason For Visit: SDA for Leg Pain/Elevated Blood pressure   # Elevated blood pressure  Has not been seen in the clinic since January 2016. States has not had insurance and had not had her medications refilled. Recently got the [facility] card and 100% of her medications covered yesterday, and would be willing to restart her blood pressure medications. Denies CP, dyspnea, HA, edema, dizziness / lightheadedness, blurry vision  - Previously taking  HCTZ, Losartan, Norvasc  # Leg Pain- Left/Right leg - Acute on chronic (3 years) - Received one shot in hip, Dr. Thurston [name]- wanted to do Epidural injections of steroids but did not want epidural injections at that time.  - 10/10 pain, radiating down the right leg from lower back - constant pain - Pain does not wake patient up at night  - Use to take tyelonol pm  - Has tried flexerol/tramadol without much relief   - No changes with sitting or standing - Numbness in both feet - at the tips of all 5 of her toes bilaterally  - No weakness with walking  - No bladder or bowel incontinence  - No hx of IV Drug  - No hx of cancer  - No fever or chills, night sweats  - No sudden weight loss  - No long term steroid use   Past Medical History Reviewed problem list.  Medications- reviewed and updated No additions to family history Social history- patient is a  smoker  Objective: BP (!) 184/100   Pulse 70   Temp 97.8 F (36.6 C) (Oral)   Wt 141 lb (64 kg)   SpO2 100%   BMI 24.20 kg/m  Gen: NAD, alert, cooperative with exam Cardio: regular rate and rhythm, S1S2 heard, no murmurs appreciated Pulm: clear to auscultation bilaterally, no wheezes, rhonchi or rales Extremities: No abnormalities noted on inspection of the back or right leg. Patient with pain with palpation of SI joints bilaterally as well as paraspinal muscles, no pain along spinal processes. 5 out of 5 strength in  L4, L5 S1 on examination, negative straight leg test, 2+ reflexes bilaterally, 2+ DP pulses MSK: Normal gait and station,  Assessment/Plan: See problem based a/p  Leg pain, bilateral Complaint of chronic sciatic pain. MRI in 2015 not consistent with findings of severe changes. Has seen orthopedic in the past - without much relief. Physical exam not consistent with significant sciatic pain, given negative straight leg test .  - As last imaging from 2 years ago will get lumbar xray  - Mobic for 14 days  - Gabapentin 100 mg TID neuropathic pain - Follow up with PCP   Essential hypertension Uncontrolled, not taking any blood pressure medications due to lack insurance. Recently received the orange card  - restart norvasc and HCTZ  - follow up with PCP - consider restarting patient's losartan at that time  - check BMET today

## 2015-11-25 ENCOUNTER — Encounter: Payer: Self-pay | Admitting: Internal Medicine

## 2015-11-30 ENCOUNTER — Ambulatory Visit: Payer: Self-pay | Admitting: Internal Medicine

## 2015-12-13 ENCOUNTER — Telehealth: Payer: Self-pay | Admitting: Internal Medicine

## 2015-12-13 DIAGNOSIS — M79605 Pain in left leg: Secondary | ICD-10-CM

## 2015-12-13 DIAGNOSIS — M79604 Pain in right leg: Secondary | ICD-10-CM

## 2015-12-13 DIAGNOSIS — I10 Essential (primary) hypertension: Secondary | ICD-10-CM

## 2015-12-13 NOTE — Telephone Encounter (Signed)
Pt called and would like us to transfer her Amlodipine, Gabapentin, and Hydrochlorothiazide to [facility]. jw

## 2015-12-14 MED ORDER — GABAPENTIN 100 MG PO CAPS: 100.0000 mg | ORAL_CAPSULE | Freq: Three times a day (TID) | ORAL | 1 refills | Status: DC

## 2015-12-14 MED ORDER — HYDROCHLOROTHIAZIDE 25 MG PO TABS: 25.0000 mg | ORAL_TABLET | Freq: Every day | ORAL | 3 refills | Status: DC

## 2015-12-14 MED ORDER — AMLODIPINE BESYLATE 10 MG PO TABS: 10.0000 mg | ORAL_TABLET | Freq: Every day | ORAL | 3 refills | Status: AC

## 2015-12-22 ENCOUNTER — Ambulatory Visit (HOSPITAL_COMMUNITY)
Admission: RE | Admit: 2015-12-22 | Discharge: 2015-12-22 | Disposition: A | Discharge status: Home / Self Care | Payer: Self-pay | Source: Ambulatory Visit | Attending: Family Medicine | Admitting: Family Medicine

## 2015-12-22 ENCOUNTER — Ambulatory Visit: Payer: Self-pay | Admitting: Internal Medicine

## 2015-12-22 DIAGNOSIS — M79604 Pain in right leg: Secondary | ICD-10-CM | POA: Insufficient documentation

## 2015-12-22 DIAGNOSIS — M4316 Spondylolisthesis, lumbar region: Secondary | ICD-10-CM | POA: Insufficient documentation

## 2015-12-22 DIAGNOSIS — M79605 Pain in left leg: Secondary | ICD-10-CM | POA: Insufficient documentation

## 2015-12-23 ENCOUNTER — Telehealth: Payer: Self-pay | Admitting: Internal Medicine

## 2015-12-23 ENCOUNTER — Ambulatory Visit: Payer: Self-pay | Admitting: Internal Medicine

## 2015-12-23 NOTE — Telephone Encounter (Signed)
None of the numbers under her name worked. I called the # under Elaine [name] without an answer and since it's not the pt's, # I didn't leave a message.

## 2015-12-26 ENCOUNTER — Telehealth: Payer: Self-pay | Admitting: Internal Medicine

## 2015-12-28 ENCOUNTER — Telehealth: Payer: Self-pay | Admitting: Internal Medicine

## 2015-12-28 NOTE — Telephone Encounter (Signed)
Called patient to discuss results of her lumbar xray. She state she is still having sciatic pain. [facility], and would like to follow up there. I stated she also needs to follow up with her PCP for her blood pressure. Patient in agreement and plans to follow up.

## 2016-11-30 ENCOUNTER — Encounter (HOSPITAL_COMMUNITY): Payer: Self-pay

## 2020-02-15 ENCOUNTER — Encounter (HOSPITAL_COMMUNITY): Payer: Self-pay | Admitting: *Deleted

## 2020-02-15 ENCOUNTER — Emergency Department (HOSPITAL_COMMUNITY)
Admission: EM | Admit: 2020-02-15 | Discharge: 2020-02-15 | Disposition: A | Discharge status: Home / Self Care | Payer: Medicaid Other | Attending: Emergency Medicine | Admitting: Emergency Medicine

## 2020-02-15 ENCOUNTER — Ambulatory Visit (HOSPITAL_COMMUNITY)
Admission: EM | Admit: 2020-02-15 | Discharge: 2020-02-15 | Disposition: A | Discharge status: Home / Self Care | Payer: Medicaid Other

## 2020-02-15 ENCOUNTER — Other Ambulatory Visit: Payer: Self-pay

## 2020-02-15 DIAGNOSIS — R519 Headache, unspecified: Secondary | ICD-10-CM | POA: Diagnosis present

## 2020-02-15 DIAGNOSIS — I10 Essential (primary) hypertension: Secondary | ICD-10-CM | POA: Diagnosis not present

## 2020-02-15 DIAGNOSIS — Z5321 Procedure and treatment not carried out due to patient leaving prior to being seen by health care provider: Secondary | ICD-10-CM | POA: Insufficient documentation

## 2020-02-15 LAB — CBC
HCT: 43 % (ref 36.0–46.0)
Hemoglobin: 13.9 g/dL (ref 12.0–15.0)
MCH: 31 pg (ref 26.0–34.0)
MCHC: 32.3 g/dL (ref 30.0–36.0)
MCV: 96 fL (ref 80.0–100.0)
Platelets: 292 10*3/uL (ref 150–400)
RBC: 4.48 MIL/uL (ref 3.87–5.11)
RDW: 13.6 % (ref 11.5–15.5)
WBC: 7.1 10*3/uL (ref 4.0–10.5)
nRBC: 0 % (ref 0.0–0.2)

## 2020-02-15 LAB — URINALYSIS, ROUTINE W REFLEX MICROSCOPIC
Bilirubin Urine: NEGATIVE
Glucose, UA: 50 mg/dL — AB
Hgb urine dipstick: NEGATIVE
Ketones, ur: NEGATIVE mg/dL
Leukocytes,Ua: NEGATIVE
Nitrite: NEGATIVE
Protein, ur: NEGATIVE mg/dL
Specific Gravity, Urine: 1.015 (ref 1.005–1.030)
pH: 6 (ref 5.0–8.0)

## 2020-02-15 LAB — BASIC METABOLIC PANEL
Anion gap: 11 (ref 5–15)
BUN: 17 mg/dL (ref 6–20)
CO2: 22 mmol/L (ref 22–32)
Calcium: 9.3 mg/dL (ref 8.9–10.3)
Chloride: 104 mmol/L (ref 98–111)
Creatinine, Ser: 1.47 mg/dL — ABNORMAL HIGH (ref 0.44–1.00)
GFR, Estimated: 41 mL/min — ABNORMAL LOW (ref >60)
Glucose, Bld: 106 mg/dL — ABNORMAL HIGH (ref 70–99)
Potassium: 3.8 mmol/L (ref 3.5–5.1)
Sodium: 137 mmol/L (ref 135–145)

## 2020-02-15 NOTE — ED Triage Notes (Signed)
After speaking to a provider and the patient, patient was sent to the ed for further evaluation due to presenting symptoms.

## 2020-02-15 NOTE — ED Notes (Signed)
Patient is being discharged from the Urgent Care and sent to the Emergency Department via POV. Per Leanne Chang, patient is in need of higher level of care due to hypertensive emergency 199/111. Patient is aware and verbalizes understanding of plan of care.  Vitals:   02/15/20 1347  Pulse: 81  Resp: 19  Temp: 98 F (36.7 C)  SpO2: 100%

## 2020-02-15 NOTE — ED Triage Notes (Signed)
Patient complains of severe headache x 2 days. Pt states she has a history of hypertension and has been out of her meds for several months. Pt is aox4 and ambulatory.

## 2020-02-15 NOTE — ED Notes (Signed)
Notified EDP (pfiffer) of patients BP Elaine [name] on arrival and only complaining of headache.  Orders received and obtaining EKG and Labs

## 2020-02-15 NOTE — ED Triage Notes (Signed)
Pt sent from UC to [facility] for elevated blood pressure and headache.  Pupils 3 RRB and states frontal headache.  Pt used to be on BP pills (Norvasc) but has not been on them in a while.  Pt has no stroke symptoms

## 2020-02-15 NOTE — ED Notes (Signed)
Per registration staff, pt left  

## 2020-07-01 ENCOUNTER — Other Ambulatory Visit (HOSPITAL_COMMUNITY): Payer: Self-pay | Admitting: Neurosurgery

## 2020-07-01 ENCOUNTER — Other Ambulatory Visit: Payer: Self-pay | Admitting: Neurosurgery

## 2020-07-01 DIAGNOSIS — M4317 Spondylolisthesis, lumbosacral region: Secondary | ICD-10-CM

## 2020-07-13 ENCOUNTER — Ambulatory Visit (HOSPITAL_COMMUNITY)
Admission: RE | Admit: 2020-07-13 | Discharge: 2020-07-13 | Disposition: A | Discharge status: Home / Self Care | Payer: Medicaid Other | Source: Ambulatory Visit | Attending: Neurosurgery | Admitting: Neurosurgery

## 2020-07-13 ENCOUNTER — Ambulatory Visit (HOSPITAL_COMMUNITY): Payer: Medicaid Other

## 2020-07-13 ENCOUNTER — Other Ambulatory Visit: Payer: Self-pay

## 2020-07-13 ENCOUNTER — Encounter (HOSPITAL_COMMUNITY): Payer: Self-pay

## 2020-07-13 DIAGNOSIS — M4317 Spondylolisthesis, lumbosacral region: Secondary | ICD-10-CM | POA: Diagnosis not present

## 2020-07-19 ENCOUNTER — Other Ambulatory Visit: Payer: Self-pay | Admitting: Neurosurgery

## 2020-08-16 ENCOUNTER — Other Ambulatory Visit (HOSPITAL_COMMUNITY): Payer: Self-pay | Admitting: Neurosurgery

## 2020-08-16 DIAGNOSIS — Z01818 Encounter for other preprocedural examination: Secondary | ICD-10-CM

## 2020-08-16 NOTE — Pre-Procedure Instructions (Addendum)
Surgical Instructions    Your procedure is scheduled on Monday 08/22/20.   Report to [facility] Main Entrance "A" at 05:30 A.M., then check in with the Admitting office.  Call this number if you have problems the morning of surgery:  [phone]  You will have a COVID TEST done on the day of surgery. Please arrive at 5:30 a.m.   If you have any questions prior to your surgery date call [phone]: Open Monday-Friday 8am-4pm    Remember:  Do not eat or drink after midnight the night before your surgery    Take these medicines the morning of surgery with A SIP OF WATER   amLODipine (NORVASC)  tetrahydrozoline-zinc (VISINE-AC)- If needed   As of today, STOP taking any Aspirin (unless otherwise instructed by your surgeon) Aleve, Naproxen, Ibuprofen, Motrin, Advil, Goody's, BC's, all herbal medications, fish oil, and all vitamins.                     Do NOT Smoke (Tobacco/Vaping) or drink Alcohol 24 hours prior to your procedure.  If you use a CPAP at night, you may bring all equipment for your overnight stay.   Contacts, glasses, piercing's, hearing aid's, dentures or partials may not be worn into surgery, please bring cases for these belongings.    For patients admitted to the hospital, discharge time will be determined by your treatment team.   Patients discharged the day of surgery will not be allowed to drive home, and someone needs to stay with them for 24 hours.  ONLY 1 SUPPORT PERSON MAY BE PRESENT WHILE YOU ARE IN SURGERY. IF YOU ARE TO BE ADMITTED ONCE YOU ARE IN YOUR ROOM YOU WILL BE ALLOWED TWO (2) VISITORS.  Minor children may have two parents present. Special consideration for safety and communication needs will be reviewed on a case by case basis.   Special instructions:   [place]- Preparing For Surgery  Before surgery, you can play an important role. Because skin is not sterile, your skin needs to be as free of germs as possible. You can reduce the number of  germs on your skin by washing with CHG (chlorahexidine gluconate) Soap before surgery.  CHG is an antiseptic cleaner which kills germs and bonds with the skin to continue killing germs even after washing.    Oral Hygiene is also important to reduce your risk of infection.  Remember - BRUSH YOUR TEETH THE MORNING OF SURGERY WITH YOUR REGULAR TOOTHPASTE  Please do not use if you have an allergy to CHG or antibacterial soaps. If your skin becomes reddened/irritated stop using the CHG.  Do not shave (including legs and underarms) for at least 48 hours prior to first CHG shower. It is OK to shave your face.  Please follow these instructions carefully.   Shower the NIGHT BEFORE SURGERY and the MORNING OF SURGERY  If you chose to wash your hair, wash your hair first as usual with your normal shampoo.  After you shampoo, rinse your hair and body thoroughly to remove the shampoo.  Use CHG Soap as you would any other liquid soap. You can apply CHG directly to the skin and wash gently with a scrungie or a clean washcloth.   Apply the CHG Soap to your body ONLY FROM THE NECK DOWN.  Do not use on open wounds or open sores. Avoid contact with your eyes, ears, mouth and genitals (private parts). Wash Face and genitals (private parts)  with your normal  soap.   Wash thoroughly, paying special attention to the area where your surgery will be performed.  Thoroughly rinse your body with warm water from the neck down.  DO NOT shower/wash with your normal soap after using and rinsing off the CHG Soap.  Pat yourself dry with a CLEAN TOWEL.  Wear CLEAN PAJAMAS to bed the night before surgery  Place CLEAN SHEETS on your bed the night before your surgery  DO NOT SLEEP WITH PETS.   Day of Surgery: Shower with CHG soap. Do not wear jewelry, make up, nail polish, gel polish, artificial nails, or any other type of covering on natural nails including finger and toenails. If patients have artificial nails, gel  coating, etc. that need to be removed by a nail salon please have this removed prior to surgery. Surgery may need to be canceled/delayed if the surgeon/ anesthesia feels like the patient is unable to be adequately monitored. Do not wear lotions, powders, perfumes/colognes, or deodorant. Do not shave 48 hours prior to surgery.  Men may shave face and neck. Do not bring valuables to the hospital. [facility] is not responsible for any belongings or valuables. Wear Clean/Comfortable clothing the morning of surgery Remember to brush your teeth WITH YOUR REGULAR TOOTHPASTE.   Please read over the following fact sheets that you were given.

## 2020-08-17 ENCOUNTER — Ambulatory Visit (HOSPITAL_COMMUNITY)
Admission: RE | Admit: 2020-08-17 | Discharge: 2020-08-17 | Disposition: A | Discharge status: Home / Self Care | Payer: Medicaid Other | Source: Ambulatory Visit | Attending: Neurosurgery | Admitting: Neurosurgery

## 2020-08-17 ENCOUNTER — Encounter (HOSPITAL_COMMUNITY): Payer: Self-pay

## 2020-08-17 ENCOUNTER — Inpatient Hospital Stay (HOSPITAL_COMMUNITY): Payer: Medicaid Other | Admitting: Anesthesiology

## 2020-08-17 ENCOUNTER — Encounter (HOSPITAL_COMMUNITY)
Admission: RE | Admit: 2020-08-17 | Discharge: 2020-08-17 | Disposition: A | Discharge status: Home / Self Care | Payer: Medicaid Other | Source: Ambulatory Visit | Attending: Neurosurgery | Admitting: Neurosurgery

## 2020-08-17 ENCOUNTER — Other Ambulatory Visit: Payer: Self-pay

## 2020-08-17 ENCOUNTER — Ambulatory Visit (HOSPITAL_COMMUNITY): Admission: RE | Admit: 2020-08-17 | Payer: Medicaid Other | Source: Ambulatory Visit

## 2020-08-17 ENCOUNTER — Inpatient Hospital Stay (HOSPITAL_COMMUNITY): Payer: Medicaid Other | Admitting: Vascular Surgery

## 2020-08-17 DIAGNOSIS — Z01818 Encounter for other preprocedural examination: Secondary | ICD-10-CM | POA: Insufficient documentation

## 2020-08-17 LAB — CBC WITH DIFFERENTIAL/PLATELET
Abs Immature Granulocytes: 0.02 10*3/uL (ref 0.00–0.07)
Basophils Absolute: 0.1 10*3/uL (ref 0.0–0.1)
Basophils Relative: 1 %
Eosinophils Absolute: 0.1 10*3/uL (ref 0.0–0.5)
Eosinophils Relative: 2 %
HCT: 41.5 % (ref 36.0–46.0)
Hemoglobin: 13.3 g/dL (ref 12.0–15.0)
Immature Granulocytes: 0 %
Lymphocytes Relative: 36 %
Lymphs Abs: 2.3 10*3/uL (ref 0.7–4.0)
MCH: 31 pg (ref 26.0–34.0)
MCHC: 32 g/dL (ref 30.0–36.0)
MCV: 96.7 fL (ref 80.0–100.0)
Monocytes Absolute: 0.6 10*3/uL (ref 0.1–1.0)
Monocytes Relative: 9 %
Neutro Abs: 3.4 10*3/uL (ref 1.7–7.7)
Neutrophils Relative %: 52 %
Platelets: 281 10*3/uL (ref 150–400)
RBC: 4.29 MIL/uL (ref 3.87–5.11)
RDW: 12.8 % (ref 11.5–15.5)
WBC: 6.5 10*3/uL (ref 4.0–10.5)
nRBC: 0 % (ref 0.0–0.2)

## 2020-08-17 LAB — TYPE AND SCREEN
ABO/RH(D): A NEG
Antibody Screen: NEGATIVE

## 2020-08-17 LAB — BASIC METABOLIC PANEL
Anion gap: 8 (ref 5–15)
BUN: 18 mg/dL (ref 6–20)
CO2: 24 mmol/L (ref 22–32)
Calcium: 9.1 mg/dL (ref 8.9–10.3)
Chloride: 106 mmol/L (ref 98–111)
Creatinine, Ser: 1.7 mg/dL — ABNORMAL HIGH (ref 0.44–1.00)
GFR, Estimated: 34 mL/min — ABNORMAL LOW (ref >60)
Glucose, Bld: 109 mg/dL — ABNORMAL HIGH (ref 70–99)
Potassium: 3.7 mmol/L (ref 3.5–5.1)
Sodium: 138 mmol/L (ref 135–145)

## 2020-08-17 LAB — SURGICAL PCR SCREEN
MRSA, PCR: NEGATIVE
Staphylococcus aureus: NEGATIVE

## 2020-08-17 NOTE — Progress Notes (Addendum)
PCP - [facility]. Elaine [name] [address] Patient cannot remember name of person she saw. States she has only been there once.  Cardiologist - denies  PPM/ICD - n/a Device Orders - n/a Rep Notified - n/a  Chest x-ray - n/a EKG - 02/16/20 Stress Test - denies ECHO - denies Cardiac Cath - denies  Sleep Study - denies CPAP - n/a  Fasting Blood Sugar - n/a Checks Blood Sugar __ n/a __ times a day  Blood Thinner Instructions: n/a Aspirin Instructions: n/a  ERAS Protcol - No. NPO after midnight PRE-SURGERY Ensure or G2- n/a  COVID TEST- To be done day of surgery. Patient stated she thought she was coming in today for a COVID test but it is too early. Per patient she is unable to go to drive through appointment. To be tested day of surgery. Cyndia Diver, RN made aware.    Anesthesia review: Yes. BP 186/96 at PAT appointment. Patient takes Norvasc 10 mg daily but states she did not take it this morning. Called and made Jaynie Collins, PA aware. Per Revonda Standard, patient instructed to check BP and call Revonda Standard with result after taking Norvasc. Patient stated "I'm not doing that". Patient did agree to take Norvasc daily. Attempting to request records from [facility]. Phone numbers attempted are out of service. Per patient the staff are only in the office on Mondays and are difficult to get in contact with. Will continue to reach out to get fax number so records can be requested.  1400- Attempted to call [facility] at several different numbers found online. No answer at some numbers and other numbers are out of service. Nurse tech also tried to find phone numbers and contact [facility] but was unsuccessful as well.   Patient denies shortness of breath, fever, cough and chest pain at PAT appointment   All instructions explained to the patient, with a verbal understanding of the material. Patient agrees to go over the instructions while at home for a better understanding.  Patient also instructed to self quarantine after being tested for COVID-19. The opportunity to ask questions was provided.
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
## 2020-08-18 NOTE — Anesthesia Preprocedure Evaluation (Deleted)
Anesthesia Evaluation  Patient identified by MRN, date of birth, ID band Patient awake    Reviewed: Allergy & Precautions, H&P , NPO status , Patient's Chart, lab work & pertinent test results  Airway Mallampati: I  TM Distance: >3 FB Neck ROM: Full    Dental no notable dental hx. (+) Teeth Intact, Poor Dentition, Dental Advisory Given   Pulmonary neg pulmonary ROS, Current Smoker and Patient abstained from smoking.,    Pulmonary exam normal breath sounds clear to auscultation       Cardiovascular Exercise Tolerance: Good hypertension, Pt. on medications Normal cardiovascular exam Rhythm:Regular Rate:Normal     Neuro/Psych PSYCHIATRIC DISORDERS Depression  Neuromuscular disease negative neurological ROS  negative psych ROS   GI/Hepatic negative GI ROS, Neg liver ROS,   Endo/Other  negative endocrine ROS  Renal/GU negative Renal ROS  negative genitourinary   Musculoskeletal  (+) Arthritis , Osteoarthritis,    Abdominal   Peds negative pediatric ROS (+)  Hematology negative hematology ROS (+)   Anesthesia Other Findings   Reproductive/Obstetrics negative OB ROS                           Anesthesia Physical Anesthesia Plan  ASA: 3  Anesthesia Plan: General   Post-op Pain Management:    Induction: Intravenous  PONV Risk Score and Plan: 3 and Ondansetron, Dexamethasone and Treatment may vary due to age or medical condition  Airway Management Planned: Oral ETT  Additional Equipment: None  Intra-op Plan:   Post-operative Plan: Extubation in OR  Informed Consent: I have reviewed the patients History and Physical, chart, labs and discussed the procedure including the risks, benefits and alternatives for the proposed anesthesia with the patient or authorized representative who has indicated his/her understanding and acceptance.       Plan Discussed with: Anesthesiologist and  CRNA  Anesthesia Plan Comments: (PAT note written 08/18/2020 by Shonna Chock, PA-C. )      Anesthesia Quick Evaluation

## 2020-08-22 ENCOUNTER — Encounter (HOSPITAL_COMMUNITY): Payer: Self-pay | Admitting: Neurosurgery

## 2020-08-22 ENCOUNTER — Inpatient Hospital Stay (HOSPITAL_COMMUNITY): Payer: Medicaid Other

## 2020-08-22 ENCOUNTER — Encounter (HOSPITAL_COMMUNITY)
Admission: RE | Disposition: A | Discharge status: Home / Self Care | Payer: Self-pay | Source: Home / Self Care | Attending: Neurosurgery

## 2020-08-22 ENCOUNTER — Ambulatory Visit (HOSPITAL_COMMUNITY)
Admission: RE | Admit: 2020-08-22 | Discharge: 2020-08-22 | Disposition: A | Discharge status: Home / Self Care | Payer: Medicaid Other | Attending: Neurosurgery | Admitting: Neurosurgery

## 2020-08-22 ENCOUNTER — Other Ambulatory Visit: Payer: Self-pay

## 2020-08-22 DIAGNOSIS — M4316 Spondylolisthesis, lumbar region: Secondary | ICD-10-CM | POA: Insufficient documentation

## 2020-08-22 DIAGNOSIS — Z539 Procedure and treatment not carried out, unspecified reason: Secondary | ICD-10-CM | POA: Diagnosis not present

## 2020-08-22 DIAGNOSIS — Z419 Encounter for procedure for purposes other than remedying health state, unspecified: Secondary | ICD-10-CM

## 2020-08-22 DIAGNOSIS — U071 COVID-19: Secondary | ICD-10-CM | POA: Insufficient documentation

## 2020-08-22 LAB — ABO/RH: ABO/RH(D): A NEG

## 2020-08-22 LAB — SARS CORONAVIRUS 2 BY RT PCR (HOSPITAL ORDER, PERFORMED IN ~~LOC~~ HOSPITAL LAB): SARS Coronavirus 2: POSITIVE — AB

## 2020-08-22 SURGERY — POSTERIOR LUMBAR FUSION 3 LEVEL
Anesthesia: General | Site: Back

## 2020-08-22 MED ORDER — THROMBIN 20000 UNITS EX SOLR
CUTANEOUS | Status: AC
  Filled 2020-08-22: qty 20000

## 2020-08-22 MED ORDER — DEXAMETHASONE SODIUM PHOSPHATE 10 MG/ML IJ SOLN
INTRAMUSCULAR | Status: AC
  Filled 2020-08-22: qty 1

## 2020-08-22 MED ORDER — MIDAZOLAM HCL 2 MG/2ML IJ SOLN
INTRAMUSCULAR | Status: AC
  Filled 2020-08-22: qty 2

## 2020-08-22 MED ORDER — CEFAZOLIN SODIUM-DEXTROSE 2-4 GM/100ML-% IV SOLN
INTRAVENOUS | Status: AC
  Filled 2020-08-22: qty 100

## 2020-08-22 MED ORDER — ORAL CARE MOUTH RINSE: 15.0000 mL | Freq: Once | OROMUCOSAL | Status: AC

## 2020-08-22 MED ORDER — VANCOMYCIN HCL 1000 MG IV SOLR
INTRAVENOUS | Status: AC
  Filled 2020-08-22: qty 1000

## 2020-08-22 MED ORDER — CHLORHEXIDINE GLUCONATE CLOTH 2 % EX PADS: 6.0000 | MEDICATED_PAD | Freq: Once | CUTANEOUS | Status: DC

## 2020-08-22 MED ORDER — CEFAZOLIN SODIUM-DEXTROSE 2-4 GM/100ML-% IV SOLN: 2.0000 g | INTRAVENOUS | Status: DC

## 2020-08-22 MED ORDER — BUPIVACAINE HCL (PF) 0.25 % IJ SOLN
INTRAMUSCULAR | Status: AC
  Filled 2020-08-22: qty 30

## 2020-08-22 MED ORDER — PROPOFOL 10 MG/ML IV BOLUS
INTRAVENOUS | Status: AC
  Filled 2020-08-22: qty 20

## 2020-08-22 MED ORDER — SUFENTANIL CITRATE 50 MCG/ML IV SOLN
INTRAVENOUS | Status: AC
  Filled 2020-08-22: qty 1

## 2020-08-22 MED ORDER — DEXAMETHASONE SODIUM PHOSPHATE 10 MG/ML IJ SOLN: 10.0000 mg | Freq: Once | INTRAMUSCULAR | Status: DC

## 2020-08-22 MED ORDER — LACTATED RINGERS IV SOLN: INTRAVENOUS | Status: DC

## 2020-08-22 MED ORDER — CHLORHEXIDINE GLUCONATE 0.12 % MT SOLN: 15.0000 mL | Freq: Once | OROMUCOSAL | Status: AC

## 2020-08-22 MED ORDER — CHLORHEXIDINE GLUCONATE 0.12 % MT SOLN
OROMUCOSAL | Status: AC
  Administered 2020-08-22: 15 mL via OROMUCOSAL
  Filled 2020-08-22: qty 15

## 2020-08-22 NOTE — Progress Notes (Signed)
Surgery cancelled due to positive covid test. Vaughan Basta, son, called to pick up patient. Patient given belongings and IV taken out. Pt informed on new results.

## 2020-08-22 NOTE — Progress Notes (Signed)
Ring removed via ring grinder with patients verbal permission.

## 2020-08-22 NOTE — Progress Notes (Signed)
Dr. Jordan Likes notified patient tested positive for covid morning of surgery.  Dr. Jordan Likes stated he would come speak with the patient.

## 2020-09-08 ENCOUNTER — Encounter (HOSPITAL_COMMUNITY): Payer: Self-pay | Admitting: Neurosurgery

## 2020-09-08 ENCOUNTER — Other Ambulatory Visit: Payer: Self-pay | Admitting: Neurosurgery

## 2020-09-09 ENCOUNTER — Inpatient Hospital Stay (HOSPITAL_COMMUNITY): Payer: Medicaid Other | Admitting: Anesthesiology

## 2020-09-09 ENCOUNTER — Inpatient Hospital Stay (HOSPITAL_COMMUNITY): Payer: Medicaid Other

## 2020-09-09 ENCOUNTER — Encounter (HOSPITAL_COMMUNITY)
Admission: RE | Disposition: A | Discharge status: Home Health | Payer: Self-pay | Source: Home / Self Care | Attending: Neurosurgery

## 2020-09-09 ENCOUNTER — Inpatient Hospital Stay (HOSPITAL_COMMUNITY)
Admission: RE | Admit: 2020-09-09 | Discharge: 2020-09-12 | DRG: 460 | Disposition: A | Discharge status: Home Health | Payer: Medicaid Other | Attending: Neurosurgery | Admitting: Neurosurgery

## 2020-09-09 ENCOUNTER — Other Ambulatory Visit: Payer: Self-pay

## 2020-09-09 ENCOUNTER — Encounter (HOSPITAL_COMMUNITY): Payer: Self-pay | Admitting: Neurosurgery

## 2020-09-09 DIAGNOSIS — M549 Dorsalgia, unspecified: Secondary | ICD-10-CM | POA: Diagnosis present

## 2020-09-09 DIAGNOSIS — I2510 Atherosclerotic heart disease of native coronary artery without angina pectoris: Secondary | ICD-10-CM | POA: Diagnosis present

## 2020-09-09 DIAGNOSIS — Z809 Family history of malignant neoplasm, unspecified: Secondary | ICD-10-CM | POA: Diagnosis not present

## 2020-09-09 DIAGNOSIS — F17210 Nicotine dependence, cigarettes, uncomplicated: Secondary | ICD-10-CM | POA: Diagnosis present

## 2020-09-09 DIAGNOSIS — I10 Essential (primary) hypertension: Secondary | ICD-10-CM | POA: Diagnosis present

## 2020-09-09 DIAGNOSIS — M4317 Spondylolisthesis, lumbosacral region: Secondary | ICD-10-CM | POA: Diagnosis present

## 2020-09-09 DIAGNOSIS — F32A Depression, unspecified: Secondary | ICD-10-CM | POA: Diagnosis present

## 2020-09-09 DIAGNOSIS — Z83438 Family history of other disorder of lipoprotein metabolism and other lipidemia: Secondary | ICD-10-CM | POA: Diagnosis not present

## 2020-09-09 DIAGNOSIS — Z79899 Other long term (current) drug therapy: Secondary | ICD-10-CM

## 2020-09-09 DIAGNOSIS — Z8249 Family history of ischemic heart disease and other diseases of the circulatory system: Secondary | ICD-10-CM

## 2020-09-09 DIAGNOSIS — M4726 Other spondylosis with radiculopathy, lumbar region: Secondary | ICD-10-CM | POA: Diagnosis present

## 2020-09-09 DIAGNOSIS — Z820 Family history of epilepsy and other diseases of the nervous system: Secondary | ICD-10-CM

## 2020-09-09 DIAGNOSIS — M5116 Intervertebral disc disorders with radiculopathy, lumbar region: Secondary | ICD-10-CM | POA: Diagnosis present

## 2020-09-09 DIAGNOSIS — Z419 Encounter for procedure for purposes other than remedying health state, unspecified: Principal | ICD-10-CM

## 2020-09-09 DIAGNOSIS — M48061 Spinal stenosis, lumbar region without neurogenic claudication: Principal | ICD-10-CM | POA: Diagnosis present

## 2020-09-09 HISTORY — PX: APPLICATION OF ROBOTIC ASSISTANCE FOR SPINAL PROCEDURE: SHX6753

## 2020-09-09 LAB — CBC WITH DIFFERENTIAL/PLATELET
Abs Immature Granulocytes: 0.1 10*3/uL — ABNORMAL HIGH (ref 0.00–0.07)
Basophils Absolute: 0 10*3/uL (ref 0.0–0.1)
Basophils Relative: 0 %
Eosinophils Absolute: 0 10*3/uL (ref 0.0–0.5)
Eosinophils Relative: 0 %
HCT: 34.4 % — ABNORMAL LOW (ref 36.0–46.0)
Hemoglobin: 10.8 g/dL — ABNORMAL LOW (ref 12.0–15.0)
Immature Granulocytes: 1 %
Lymphocytes Relative: 7 %
Lymphs Abs: 1 10*3/uL (ref 0.7–4.0)
MCH: 30.6 pg (ref 26.0–34.0)
MCHC: 31.4 g/dL (ref 30.0–36.0)
MCV: 97.5 fL (ref 80.0–100.0)
Monocytes Absolute: 0.4 10*3/uL (ref 0.1–1.0)
Monocytes Relative: 3 %
Neutro Abs: 11.8 10*3/uL — ABNORMAL HIGH (ref 1.7–7.7)
Neutrophils Relative %: 89 %
Platelets: 222 10*3/uL (ref 150–400)
RBC: 3.53 MIL/uL — ABNORMAL LOW (ref 3.87–5.11)
RDW: 12.5 % (ref 11.5–15.5)
WBC: 13.3 10*3/uL — ABNORMAL HIGH (ref 4.0–10.5)
nRBC: 0 % (ref 0.0–0.2)

## 2020-09-09 LAB — SURGICAL PCR SCREEN
MRSA, PCR: NEGATIVE
Staphylococcus aureus: NEGATIVE

## 2020-09-09 LAB — TYPE AND SCREEN
ABO/RH(D): A NEG
Antibody Screen: NEGATIVE

## 2020-09-09 SURGERY — POSTERIOR LUMBAR FUSION 3 LEVEL
Anesthesia: General | Site: Back

## 2020-09-09 MED ORDER — ONDANSETRON HCL 4 MG/2ML IJ SOLN
INTRAMUSCULAR | Status: AC
  Filled 2020-09-09: qty 2

## 2020-09-09 MED ORDER — PHENOL 1.4 % MT LIQD: 1.0000 | OROMUCOSAL | Status: DC | PRN

## 2020-09-09 MED ORDER — ACETAMINOPHEN 650 MG RE SUPP: 650.0000 mg | RECTAL | Status: DC | PRN

## 2020-09-09 MED ORDER — DEXAMETHASONE SODIUM PHOSPHATE 10 MG/ML IJ SOLN: 10.0000 mg | Freq: Once | INTRAMUSCULAR | Status: DC

## 2020-09-09 MED ORDER — FENTANYL CITRATE (PF) 250 MCG/5ML IJ SOLN
INTRAMUSCULAR | Status: AC
  Filled 2020-09-09: qty 5

## 2020-09-09 MED ORDER — CHLORHEXIDINE GLUCONATE 0.12 % MT SOLN
OROMUCOSAL | Status: AC
  Administered 2020-09-09: 15 mL
  Filled 2020-09-09: qty 15

## 2020-09-09 MED ORDER — DIPHENHYDRAMINE-APAP (SLEEP) 25-500 MG PO TABS
1.0000 | ORAL_TABLET | Freq: Every evening | ORAL | Status: DC | PRN
Start: 2020-09-09 — End: 2020-09-09

## 2020-09-09 MED ORDER — ALBUMIN HUMAN 5 % IV SOLN: 12.5000 g | Freq: Once | INTRAVENOUS | Status: AC

## 2020-09-09 MED ORDER — PROMETHAZINE HCL 25 MG/ML IJ SOLN: 6.2500 mg | INTRAMUSCULAR | Status: DC | PRN

## 2020-09-09 MED ORDER — HYDROMORPHONE HCL 1 MG/ML IJ SOLN: 0.2500 mg | INTRAMUSCULAR | Status: DC | PRN

## 2020-09-09 MED ORDER — SODIUM CHLORIDE 0.9% FLUSH: 3.0000 mL | INTRAVENOUS | Status: DC | PRN

## 2020-09-09 MED ORDER — FLEET ENEMA 7-19 GM/118ML RE ENEM: 1.0000 | ENEMA | Freq: Once | RECTAL | Status: DC | PRN

## 2020-09-09 MED ORDER — MENTHOL 3 MG MT LOZG: 1.0000 | LOZENGE | OROMUCOSAL | Status: DC | PRN

## 2020-09-09 MED ORDER — ONDANSETRON HCL 4 MG/2ML IJ SOLN: 4.0000 mg | Freq: Four times a day (QID) | INTRAMUSCULAR | Status: DC | PRN

## 2020-09-09 MED ORDER — FENTANYL CITRATE (PF) 100 MCG/2ML IJ SOLN
INTRAMUSCULAR | Status: DC | PRN
  Administered 2020-09-09: 100 ug via INTRAVENOUS
  Administered 2020-09-09: 50 ug via INTRAVENOUS
  Administered 2020-09-09: 100 ug via INTRAVENOUS

## 2020-09-09 MED ORDER — LIDOCAINE 2% (20 MG/ML) 5 ML SYRINGE
INTRAMUSCULAR | Status: AC
  Filled 2020-09-09: qty 5

## 2020-09-09 MED ORDER — CHLORHEXIDINE GLUCONATE CLOTH 2 % EX PADS: 6.0000 | MEDICATED_PAD | Freq: Once | CUTANEOUS | Status: DC

## 2020-09-09 MED ORDER — NAPHAZOLINE-GLYCERIN 0.012-0.25 % OP SOLN
1.0000 [drp] | Freq: Four times a day (QID) | OPHTHALMIC | Status: DC | PRN
  Filled 2020-09-09: qty 15

## 2020-09-09 MED ORDER — BISACODYL 10 MG RE SUPP: 10.0000 mg | Freq: Every day | RECTAL | Status: DC | PRN

## 2020-09-09 MED ORDER — PHENYLEPHRINE HCL-NACL 20-0.9 MG/250ML-% IV SOLN
INTRAVENOUS | Status: DC | PRN
Start: 2020-09-09 — End: 2020-09-09
  Administered 2020-09-09: 50 ug/min via INTRAVENOUS

## 2020-09-09 MED ORDER — ALBUMIN HUMAN 5 % IV SOLN: INTRAVENOUS | Status: DC | PRN

## 2020-09-09 MED ORDER — AMLODIPINE BESYLATE 10 MG PO TABS
10.0000 mg | ORAL_TABLET | Freq: Every day | ORAL | Status: DC
  Administered 2020-09-10 – 2020-09-12 (×3): 10 mg via ORAL
  Filled 2020-09-09 (×2): qty 1
  Filled 2020-09-09: qty 2

## 2020-09-09 MED ORDER — VANCOMYCIN HCL 1000 MG IV SOLR
INTRAVENOUS | Status: AC
  Filled 2020-09-09: qty 20

## 2020-09-09 MED ORDER — ASCORBIC ACID 500 MG PO TABS
500.0000 mg | ORAL_TABLET | Freq: Every day | ORAL | Status: DC
  Administered 2020-09-10 – 2020-09-12 (×3): 500 mg via ORAL
  Filled 2020-09-09 (×3): qty 1

## 2020-09-09 MED ORDER — ONDANSETRON HCL 4 MG PO TABS: 4.0000 mg | ORAL_TABLET | Freq: Four times a day (QID) | ORAL | Status: DC | PRN

## 2020-09-09 MED ORDER — DEXAMETHASONE SODIUM PHOSPHATE 4 MG/ML IJ SOLN
INTRAMUSCULAR | Status: DC | PRN
  Administered 2020-09-09: 10 mg via INTRAVENOUS

## 2020-09-09 MED ORDER — ALBUMIN HUMAN 5 % IV SOLN
INTRAVENOUS | Status: AC
  Administered 2020-09-09: 12.5 g via INTRAVENOUS
  Filled 2020-09-09: qty 250

## 2020-09-09 MED ORDER — BUPIVACAINE HCL (PF) 0.25 % IJ SOLN
INTRAMUSCULAR | Status: DC | PRN
  Administered 2020-09-09: 30 mL

## 2020-09-09 MED ORDER — HYDROMORPHONE HCL 1 MG/ML IJ SOLN
INTRAMUSCULAR | Status: AC
  Administered 2020-09-09: 0.25 mg via INTRAVENOUS
  Filled 2020-09-09: qty 1

## 2020-09-09 MED ORDER — HYDROCODONE-ACETAMINOPHEN 10-325 MG PO TABS
1.0000 | ORAL_TABLET | ORAL | Status: DC | PRN
  Administered 2020-09-10 – 2020-09-12 (×5): 1 via ORAL
  Filled 2020-09-09 (×5): qty 1

## 2020-09-09 MED ORDER — ORAL CARE MOUTH RINSE: 15.0000 mL | Freq: Once | OROMUCOSAL | Status: DC

## 2020-09-09 MED ORDER — VANCOMYCIN HCL 1000 MG IV SOLR
INTRAVENOUS | Status: DC | PRN
  Administered 2020-09-09: 1000 mg via TOPICAL

## 2020-09-09 MED ORDER — PROPOFOL 10 MG/ML IV BOLUS
INTRAVENOUS | Status: DC | PRN
  Administered 2020-09-09: 120 mg via INTRAVENOUS

## 2020-09-09 MED ORDER — SUGAMMADEX SODIUM 200 MG/2ML IV SOLN
INTRAVENOUS | Status: DC | PRN
  Administered 2020-09-09: 125 mg via INTRAVENOUS

## 2020-09-09 MED ORDER — LACTATED RINGERS IV SOLN: INTRAVENOUS | Status: DC

## 2020-09-09 MED ORDER — SODIUM CHLORIDE 0.9 % IV SOLN
250.0000 mL | INTRAVENOUS | Status: DC
  Administered 2020-09-09: 250 mL via INTRAVENOUS

## 2020-09-09 MED ORDER — HYDROMORPHONE HCL 1 MG/ML IJ SOLN: 1.0000 mg | INTRAMUSCULAR | Status: DC | PRN

## 2020-09-09 MED ORDER — ROCURONIUM BROMIDE 10 MG/ML (PF) SYRINGE
PREFILLED_SYRINGE | INTRAVENOUS | Status: AC
  Filled 2020-09-09: qty 10

## 2020-09-09 MED ORDER — CEFAZOLIN SODIUM-DEXTROSE 2-4 GM/100ML-% IV SOLN
2.0000 g | INTRAVENOUS | Status: AC
  Administered 2020-09-09 (×2): 2 g via INTRAVENOUS
  Filled 2020-09-09: qty 100

## 2020-09-09 MED ORDER — MIDAZOLAM HCL 2 MG/2ML IJ SOLN
INTRAMUSCULAR | Status: AC
  Filled 2020-09-09: qty 2

## 2020-09-09 MED ORDER — THROMBIN 20000 UNITS EX SOLR: CUTANEOUS | Status: DC | PRN

## 2020-09-09 MED ORDER — MIDAZOLAM HCL 5 MG/5ML IJ SOLN
INTRAMUSCULAR | Status: DC | PRN
  Administered 2020-09-09: 2 mg via INTRAVENOUS

## 2020-09-09 MED ORDER — CEFAZOLIN SODIUM-DEXTROSE 1-4 GM/50ML-% IV SOLN
1.0000 g | Freq: Three times a day (TID) | INTRAVENOUS | Status: AC
  Administered 2020-09-09 (×2): 1 g via INTRAVENOUS
  Filled 2020-09-09 (×2): qty 50

## 2020-09-09 MED ORDER — PROPOFOL 10 MG/ML IV BOLUS
INTRAVENOUS | Status: AC
  Filled 2020-09-09: qty 40

## 2020-09-09 MED ORDER — ACETAMINOPHEN 500 MG PO TABS
1000.0000 mg | ORAL_TABLET | Freq: Once | ORAL | Status: AC
  Administered 2020-09-09: 1000 mg via ORAL
  Filled 2020-09-09: qty 2

## 2020-09-09 MED ORDER — OXYCODONE HCL 5 MG PO TABS
10.0000 mg | ORAL_TABLET | ORAL | Status: DC | PRN
  Administered 2020-09-09 – 2020-09-12 (×8): 10 mg via ORAL
  Filled 2020-09-09 (×8): qty 2

## 2020-09-09 MED ORDER — CHLORHEXIDINE GLUCONATE 0.12 % MT SOLN: 15.0000 mL | Freq: Once | OROMUCOSAL | Status: DC

## 2020-09-09 MED ORDER — THROMBIN 20000 UNITS EX SOLR
CUTANEOUS | Status: AC
  Filled 2020-09-09: qty 20000

## 2020-09-09 MED ORDER — SODIUM CHLORIDE 0.9% FLUSH
3.0000 mL | Freq: Two times a day (BID) | INTRAVENOUS | Status: DC
  Administered 2020-09-11: 3 mL via INTRAVENOUS

## 2020-09-09 MED ORDER — DIAZEPAM 5 MG PO TABS
5.0000 mg | ORAL_TABLET | Freq: Four times a day (QID) | ORAL | Status: DC | PRN
  Administered 2020-09-09 – 2020-09-11 (×4): 5 mg via ORAL
  Filled 2020-09-09 (×4): qty 1

## 2020-09-09 MED ORDER — ONDANSETRON HCL 4 MG/2ML IJ SOLN
INTRAMUSCULAR | Status: DC | PRN
  Administered 2020-09-09: 4 mg via INTRAVENOUS

## 2020-09-09 MED ORDER — DIPHENHYDRAMINE HCL 25 MG PO CAPS: 25.0000 mg | ORAL_CAPSULE | Freq: Every evening | ORAL | Status: DC | PRN

## 2020-09-09 MED ORDER — DEXAMETHASONE SODIUM PHOSPHATE 10 MG/ML IJ SOLN
INTRAMUSCULAR | Status: AC
  Filled 2020-09-09: qty 1

## 2020-09-09 MED ORDER — ROCURONIUM BROMIDE 10 MG/ML (PF) SYRINGE
PREFILLED_SYRINGE | INTRAVENOUS | Status: DC | PRN
  Administered 2020-09-09: 60 mg via INTRAVENOUS
  Administered 2020-09-09: 40 mg via INTRAVENOUS
  Administered 2020-09-09 (×2): 20 mg via INTRAVENOUS

## 2020-09-09 MED ORDER — POLYETHYLENE GLYCOL 3350 17 G PO PACK: 17.0000 g | PACK | Freq: Every day | ORAL | Status: DC | PRN

## 2020-09-09 MED ORDER — EPHEDRINE SULFATE-NACL 50-0.9 MG/10ML-% IV SOSY
PREFILLED_SYRINGE | INTRAVENOUS | Status: DC | PRN
  Administered 2020-09-09: 10 mg via INTRAVENOUS
  Administered 2020-09-09: 5 mg via INTRAVENOUS
  Administered 2020-09-09: 10 mg via INTRAVENOUS

## 2020-09-09 MED ORDER — CEFAZOLIN SODIUM 1 G IJ SOLR
INTRAMUSCULAR | Status: AC
  Filled 2020-09-09: qty 20

## 2020-09-09 MED ORDER — PHENYLEPHRINE 40 MCG/ML (10ML) SYRINGE FOR IV PUSH (FOR BLOOD PRESSURE SUPPORT)
PREFILLED_SYRINGE | INTRAVENOUS | Status: DC | PRN
  Administered 2020-09-09: 120 ug via INTRAVENOUS
  Administered 2020-09-09 (×2): 80 ug via INTRAVENOUS

## 2020-09-09 MED ORDER — ACETAMINOPHEN 325 MG PO TABS
650.0000 mg | ORAL_TABLET | ORAL | Status: DC | PRN
  Administered 2020-09-09 – 2020-09-12 (×4): 650 mg via ORAL
  Filled 2020-09-09 (×4): qty 2

## 2020-09-09 MED ORDER — LIDOCAINE 2% (20 MG/ML) 5 ML SYRINGE
INTRAMUSCULAR | Status: DC | PRN
  Administered 2020-09-09: 60 mg via INTRAVENOUS

## 2020-09-09 MED ORDER — ACETAMINOPHEN 500 MG PO TABS: 500.0000 mg | ORAL_TABLET | Freq: Every evening | ORAL | Status: DC | PRN

## 2020-09-09 MED ORDER — BUPIVACAINE HCL (PF) 0.25 % IJ SOLN
INTRAMUSCULAR | Status: AC
  Filled 2020-09-09: qty 30

## 2020-09-09 MED ORDER — 0.9 % SODIUM CHLORIDE (POUR BTL) OPTIME
TOPICAL | Status: DC | PRN
  Administered 2020-09-09: 1000 mL

## 2020-09-09 SURGICAL SUPPLY — 84 items
ADH SKN CLS APL DERMABOND .7 (GAUZE/BANDAGES/DRESSINGS) ×3
APL SKNCLS STERI-STRIP NONHPOA (GAUZE/BANDAGES/DRESSINGS) ×3
BAG COUNTER SPONGE SURGICOUNT (BAG) ×9 IMPLANT
BAG DECANTER FOR FLEXI CONT (MISCELLANEOUS) ×3 IMPLANT
BAG SPNG CNTER NS LX DISP (BAG) ×9
BENZOIN TINCTURE PRP APPL 2/3 (GAUZE/BANDAGES/DRESSINGS) ×4 IMPLANT
BIT DRILL LONG 3.0X30 (BIT) ×1 IMPLANT
BIT DRILL LONG 3X80 (BIT) IMPLANT
BIT DRILL LONG 4X80 (BIT) ×1 IMPLANT
BIT DRILL SHORT 3.0X30 (BIT) IMPLANT
BIT DRILL SHORT 3X80 (BIT) IMPLANT
BLADE CLIPPER SURG (BLADE) IMPLANT
BLADE SURG 11 STRL SS (BLADE) ×4 IMPLANT
BONE GRAFTON DBF INJECT 6CC (Bone Implant) ×2 IMPLANT
BUR CUTTER 7.0 ROUND (BURR) IMPLANT
BUR MATCHSTICK NEURO 3.0 LAGG (BURR) ×4 IMPLANT
CAGE EXP CATALYFT 9 (Plate) ×2 IMPLANT
CAGE EXP CATALYFT SHORT 9X22.5 (Cage) ×2 IMPLANT
CANISTER SUCT 3000ML PPV (MISCELLANEOUS) ×4 IMPLANT
CARTRIDGE OIL MAESTRO DRILL (MISCELLANEOUS) ×3 IMPLANT
CNTNR URN SCR LID CUP LEK RST (MISCELLANEOUS) ×3 IMPLANT
CONT SPEC 4OZ STRL OR WHT (MISCELLANEOUS) ×4
DECANTER SPIKE VIAL GLASS SM (MISCELLANEOUS) ×3 IMPLANT
DERMABOND ADVANCED (GAUZE/BANDAGES/DRESSINGS) ×1
DERMABOND ADVANCED .7 DNX12 (GAUZE/BANDAGES/DRESSINGS) ×3 IMPLANT
DIFFUSER DRILL AIR PNEUMATIC (MISCELLANEOUS) ×4 IMPLANT
DRAPE C-ARM 42X72 X-RAY (DRAPES) ×8 IMPLANT
DRAPE C-ARMOR (DRAPES) ×1 IMPLANT
DRAPE HALF SHEET 40X57 (DRAPES) IMPLANT
DRAPE LAPAROTOMY 100X72X124 (DRAPES) ×4 IMPLANT
DRAPE SHEET LG 3/4 BI-LAMINATE (DRAPES) ×4 IMPLANT
DRAPE SURG 17X23 STRL (DRAPES) ×16 IMPLANT
DRSG OPSITE POSTOP 4X6 (GAUZE/BANDAGES/DRESSINGS) ×3 IMPLANT
DRSG OPSITE POSTOP 4X8 (GAUZE/BANDAGES/DRESSINGS) ×1 IMPLANT
DURAPREP 26ML APPLICATOR (WOUND CARE) ×4 IMPLANT
ELECT BLADE 4.0 EZ CLEAN MEGAD (MISCELLANEOUS)
ELECT REM PT RETURN 9FT ADLT (ELECTROSURGICAL) ×4
ELECTRODE BLDE 4.0 EZ CLN MEGD (MISCELLANEOUS) IMPLANT
ELECTRODE REM PT RTRN 9FT ADLT (ELECTROSURGICAL) ×3 IMPLANT
EVACUATOR 1/8 PVC DRAIN (DRAIN) IMPLANT
GAUZE 4X4 16PLY ~~LOC~~+RFID DBL (SPONGE) ×1 IMPLANT
GAUZE SPONGE 4X4 12PLY STRL (GAUZE/BANDAGES/DRESSINGS) IMPLANT
GLOVE SURG ENC MOIS LTX SZ6.5 (GLOVE) ×4 IMPLANT
GLOVE SURG LTX SZ9 (GLOVE) ×8 IMPLANT
GLOVE SURG UNDER POLY LF SZ6.5 (GLOVE) ×4 IMPLANT
GOWN STRL REUS W/ TWL LRG LVL3 (GOWN DISPOSABLE) IMPLANT
GOWN STRL REUS W/ TWL XL LVL3 (GOWN DISPOSABLE) ×6 IMPLANT
GOWN STRL REUS W/TWL 2XL LVL3 (GOWN DISPOSABLE) IMPLANT
GOWN STRL REUS W/TWL LRG LVL3 (GOWN DISPOSABLE) ×4
GOWN STRL REUS W/TWL XL LVL3 (GOWN DISPOSABLE) ×12
GRAFT BN 10X1XDBM MAGNIFUSE (Bone Implant) IMPLANT
GRAFT BONE MAGNIFUSE 1X10CM (Bone Implant) ×4 IMPLANT
GUIDEWIRE BLUNT NT 450 (WIRE) ×8 IMPLANT
KIT BASIN OR (CUSTOM PROCEDURE TRAY) ×4 IMPLANT
KIT SPINE MAZOR X ROBO DISP (MISCELLANEOUS) ×4 IMPLANT
KIT TURNOVER KIT B (KITS) ×4 IMPLANT
MILL MEDIUM DISP (BLADE) ×4 IMPLANT
NAIL FENS MAS 5.5/6 7.5X35 (Nail) ×2 IMPLANT
NEEDLE HYPO 22GX1.5 SAFETY (NEEDLE) ×4 IMPLANT
NS IRRIG 1000ML POUR BTL (IV SOLUTION) ×4 IMPLANT
OIL CARTRIDGE MAESTRO DRILL (MISCELLANEOUS) ×4
PACK LAMINECTOMY NEURO (CUSTOM PROCEDURE TRAY) ×4 IMPLANT
PIN HEAD 2.5X60MM (PIN) IMPLANT
ROD SOLERA 80MM (Rod) ×8 IMPLANT
ROD SOLERA 80X5.5XNS TI (Rod) IMPLANT
SCREW BS CNCMA S 8.5X80 T/C (Screw) ×2 IMPLANT
SCREW SCHANZ SA 4.0MM (MISCELLANEOUS) ×1 IMPLANT
SCREW SET SOLERA (Screw) ×32 IMPLANT
SCREW SET SOLERA TI5.5 (Screw) IMPLANT
SCREW SOLERA 40X6.5XMA (Screw) IMPLANT
SCREW SOLERA 45X6.5XMA (Screw) IMPLANT
SCREW SOLERA 6.5X40MM (Screw) ×8 IMPLANT
SCREW SOLERA 6.5X45MM (Screw) ×8 IMPLANT
SPONGE SURGIFOAM ABS GEL 100 (HEMOSTASIS) ×4 IMPLANT
STRIP CLOSURE SKIN 1/2X4 (GAUZE/BANDAGES/DRESSINGS) ×7 IMPLANT
SUT VIC AB 0 CT1 18XCR BRD8 (SUTURE) ×6 IMPLANT
SUT VIC AB 0 CT1 8-18 (SUTURE) ×8
SUT VIC AB 2-0 CT1 18 (SUTURE) ×8 IMPLANT
SUT VIC AB 3-0 SH 8-18 (SUTURE) ×8 IMPLANT
TOWEL GREEN STERILE (TOWEL DISPOSABLE) ×4 IMPLANT
TOWEL GREEN STERILE FF (TOWEL DISPOSABLE) ×4 IMPLANT
TRAY FOLEY MTR SLVR 16FR STAT (SET/KITS/TRAYS/PACK) ×4 IMPLANT
TUBE MAZOR SA REDUCTION (TUBING) ×4 IMPLANT
WATER STERILE IRR 1000ML POUR (IV SOLUTION) ×4 IMPLANT

## 2020-09-09 NOTE — Transfer of Care (Signed)
Immediate Anesthesia Transfer of Care Note  Patient: Elaine [name]  Procedure(s) Performed: Posterior Lumbar Interbody Fusion - Lumbar four-five, Lumbar five-Sacral one, Sacral two Alar iliac screws with Mazor (Back) APPLICATION OF ROBOTIC ASSISTANCE FOR SPINAL PROCEDURE APPLICATION OF CELL SAVER  Patient Location: PACU  Anesthesia Type:General  Level of Consciousness: awake  Airway & Oxygen Therapy: Patient Spontanous Breathing and Patient connected to face mask oxygen  Post-op Assessment: Report given to RN and Post -op Vital signs reviewed and stable  Post vital signs: Reviewed and stable  Last Vitals:  Vitals Value Taken Time  BP 114/80 09/09/20 1236  Temp    Pulse 79 09/09/20 1247  Resp 19 09/09/20 1247  SpO2 96 % 09/09/20 1247  Vitals shown include unvalidated device data.  Last Pain:  Vitals:   09/09/20 0657  TempSrc:   PainSc: 0-No pain         Complications: No notable events documented.

## 2020-09-09 NOTE — Progress Notes (Signed)
Orthopedic Tech Progress Note Patient Details:  Elaine [name] 12/05/1960 [id number] Patient has brace Patient ID: Elaine [name], female   DOB: [birth year], 60 y.o.   MRN: [id number]  Elaine [name] 09/09/2020, 7:53 PM

## 2020-09-09 NOTE — Anesthesia Preprocedure Evaluation (Signed)
Anesthesia Evaluation  Patient identified by MRN, date of birth, ID band Patient awake    Reviewed: Allergy & Precautions, NPO status , Patient's Chart, lab work & pertinent test results  Airway Mallampati: II  TM Distance: >3 FB Neck ROM: Full    Dental  (+) Dental Advisory Given   Pulmonary Current Smoker,    breath sounds clear to auscultation       Cardiovascular hypertension, Pt. on medications  Rhythm:Regular Rate:Normal     Neuro/Psych  Neuromuscular disease    GI/Hepatic negative GI ROS, Neg liver ROS,   Endo/Other  negative endocrine ROS  Renal/GU Renal InsufficiencyRenal disease     Musculoskeletal  (+) Arthritis ,   Abdominal   Peds  Hematology negative hematology ROS (+)   Anesthesia Other Findings   Reproductive/Obstetrics                            Lab Results  Component Value Date   WBC 6.5 08/17/2020   HGB 13.3 08/17/2020   HCT 41.5 08/17/2020   MCV 96.7 08/17/2020   PLT 281 08/17/2020   Lab Results  Component Value Date   CREATININE 1.70 (H) 08/17/2020   BUN 18 08/17/2020   NA 138 08/17/2020   K 3.7 08/17/2020   CL 106 08/17/2020   CO2 24 08/17/2020    Anesthesia Physical Anesthesia Plan  ASA: 2  Anesthesia Plan: General   Post-op Pain Management:    Induction: Intravenous  PONV Risk Score and Plan: 2 and Dexamethasone, Ondansetron and Treatment may vary due to age or medical condition  Airway Management Planned: Oral ETT  Additional Equipment:   Intra-op Plan:   Post-operative Plan: Extubation in OR  Informed Consent: I have reviewed the patients History and Physical, chart, labs and discussed the procedure including the risks, benefits and alternatives for the proposed anesthesia with the patient or authorized representative who has indicated his/her understanding and acceptance.       Plan Discussed with:   Anesthesia Plan Comments:          Anesthesia Quick Evaluation

## 2020-09-09 NOTE — Anesthesia Procedure Notes (Signed)
Procedure Name: Intubation Date/Time: 09/09/2020 8:06 AM Performed by: Caren Macadam, CRNA Pre-anesthesia Checklist: Patient identified, Emergency Drugs available, Suction available and Patient being monitored Patient Re-evaluated:Patient Re-evaluated prior to induction Oxygen Delivery Method: Circle system utilized Preoxygenation: Pre-oxygenation with 100% oxygen Induction Type: IV induction Ventilation: Mask ventilation without difficulty Laryngoscope Size: Miller and 2 Grade View: Grade I Tube type: Oral Tube size: 7.0 mm Number of attempts: 1 Airway Equipment and Method: Stylet Placement Confirmation: ETT inserted through vocal cords under direct vision, positive ETCO2 and breath sounds checked- equal and bilateral Secured at: 22 cm Tube secured with: Tape Dental Injury: Teeth and Oropharynx as per pre-operative assessment

## 2020-09-09 NOTE — Progress Notes (Signed)
Postop check.  Overall doing well.  Pain well controlled.  Complains of tightness in her back and some tingling in her feet bilaterally.  No radicular pain.  Motor exam intact.  Overall doing well.  Mobilize tonight.  Hopeful discharge tomorrow.

## 2020-09-09 NOTE — Brief Op Note (Signed)
09/09/2020  12:22 PM  PATIENT:  Elaine [name]  60 y.o. female  PRE-OPERATIVE DIAGNOSIS:  Spondylolisthesis  POST-OPERATIVE DIAGNOSIS:  Spondylolisthesis  PROCEDURE:  Procedure(s): Posterior Lumbar Interbody Fusion - Lumbar four-five, Lumbar five-Sacral one, Sacral two Alar iliac screws with Mazor (N/A) APPLICATION OF ROBOTIC ASSISTANCE FOR SPINAL PROCEDURE (N/A) APPLICATION OF CELL SAVER  SURGEON:  Surgeon(s) and Role:    Julio Sicks, MD - Primary  PHYSICIAN ASSISTANT:   ASSISTANTSMarland Mcalpine   ANESTHESIA:   general  EBL:  200 mL   BLOOD ADMINISTERED:none  DRAINS: none   LOCAL MEDICATIONS USED:  MARCAINE     SPECIMEN:  No Specimen  DISPOSITION OF SPECIMEN:  N/A  COUNTS:  YES  TOURNIQUET:  * No tourniquets in log *  DICTATION: .Dragon Dictation  PLAN OF CARE: Admit to inpatient   PATIENT DISPOSITION:  PACU - hemodynamically stable.   Delay start of Pharmacological VTE agent (>24hrs) due to surgical blood loss or risk of bleeding: yes

## 2020-09-09 NOTE — Op Note (Signed)
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  Bilateral L5-S1 Gill procedure with bilateral L5 and S1 decompressive foraminotomies, more than would be required for simple interbody fusion alone.  L4-5, L5-S1 posterior lumbar interbody fusion utilizing interbody cages, local harvested autograft, and morselized allograft  L4, L5, S1, S2 posterior lateral arthrodesis utilizing segmental pedicle screw fixation and including S2 alar iliac wing fixation,  Mazor stereotactic robot guidance for instrumentation  Surgeon:Greenleigh Kauth A.Dorothia Passmore, M.D.  Asst. Surgeon: Doran Durand, NP  Anesthesia: General  Indication: 60 year old female with severe bilateral lower extremity radicular pain failing conservative management.  Work-up demonstrates evidence of a progressively worsening now grade 3 lytic spondylolisthesis at L5-S1 with marked foraminal stenosis and significant central stenosis.  Patient also with progressive disc degeneration and facet arthropathy at L4-5.  Patient presents now for decompression and fusion at both levels.   Operative note:  After induction anesthesia, patient edition prone onto a Jackson table and appropriately padded.  Patient's lumbar region prepped and draped sterilely.  Incision made from L4-S2.  Dissection performed bilaterally.  Retractor placed and levels were confirmed.  A separate incision was then made overlying the right sided posterior superior iliac spine.  A pin anchor was then placed into the PSIS to secure the stereotactic guide.  The robot was then connected to the iliac pin and the  patient was then registered rated with fluoroscopy.  Stereotactic plan was confirmed and guidance was utilized at L4 L5-S1 and S2.  Using the robot guidance pilot holes were drilled.  Guidewire was placed and confirmed to be in solid bone and then the pedicles at L4-L5 and S1 were then tapped with a screw tap and then 6.5 millimeter screws were placed bilaterally at L4 and L5 and 7.5 millimeter screws were placed bilaterally at S1.  The S2 alar iliac screws were placed in a similar fashion.  The screw tract was tapped and found to be solid within the bone.  80 mm 8.5 millimeter screws were placed bilaterally and an S2 alar iliac wing fashion.  Fluoroscopic imaging was then used to confirm good placement of the screws at L4 L5-S1 and S2 without any evidence of complicating features.  Decompressive laminotomies were then performed using Leksell rongeurs and Kerrison rongeurs to remove the inferior aspect lamina of L4 the entire inferior facet of L4 the majority of the superior facet of L5 and the superior aspect of the L5 lamina.  Ligament flavum elevated and resected.  Epidural venous plexus coagulated and cut.  Bilateral discectomies then performed.  Attention then placed L5-S1.  Complete laminectomy and facetectomies were performed bilaterally at L5 and S1 consistent with a Gill type decompression.  Wide aggressive foraminotomies were performed along the exiting L5 nerve roots bilaterally.  The angle of the disc space at L5-S1 was quite severe.  Posterior superior aspect of the sacrum was slightly resected.  The disc base was identified.  The disc space was then entered and cleaned of soft tissue.  The spaces were then prepared for interbody fusion.  With distractors placed the patient's right side the spaces were cleaned of soft tissue.  [employer]onic expandable cage was then impacted into place at both L4-5 and L5-S1.  Cage  was then expanded to their full extent.  Distractor removed patient's right side.  The space  then prepared on the right side.  Soft tissue removed in the interspace.  Morselized autograft packed in the interspace.  A second cage was then packed into place and expanded at L4-5 and L5-S1.  Final images reveal good position of the cages and the hardware at the proper operative level with much improved alignment of the spine.  Short segment titanium rods and placed over the screw heads at L4 L5-S1 and S2.  Locking caps placed over the screws and locking caps and engaged.  Transverse processes and sacral ala were decorticated.  Morselized autograft was packed posterior laterally as were allograft packets.  The cages were filled additionally with demineralized bone fibers.  Gelfoam was placed over the laminectomy defect and laminotomy defects.  Vancomycin powder placed in deep wound space.  Wounds were closed in layers of use.  Steri-Strips and sterile dressing were applied.  No apparent complications.  Patient tolerated the procedure well and she returns to the recovery room postop.
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
## 2020-09-10 MED ORDER — OXYCODONE-ACETAMINOPHEN 5-325 MG PO TABS: 1.0000 | ORAL_TABLET | ORAL | 0 refills | Status: AC | PRN

## 2020-09-10 MED ORDER — METHOCARBAMOL 500 MG PO TABS: 500.0000 mg | ORAL_TABLET | Freq: Four times a day (QID) | ORAL | 0 refills | Status: AC

## 2020-09-10 NOTE — Discharge Summary (Signed)
Physician Discharge Summary  Patient ID: Elaine [name] MRN: [id number] DOB/AGE: 05-17-1960 60 y.o.  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 The patient tolerated the procedure well and was taken to the recovery room and then to the floor in stable condition. The hospital course was routine. There were no complications. The wound remained clean dry and intact. Pt had appropriate back soreness. No complaints of leg pain or new N/T/W. The patient remained afebrile with stable vital signs, and tolerated a regular diet. The patient continued to increase activities, and pain was well controlled with oral pain medications.   Consults: None  Significant Diagnostic Studies:  Results for orders placed or performed during the hospital encounter of 09/09/20  Surgical pcr screen   Specimen: Nasal Mucosa; Nasal Swab  Result Value Ref Range   MRSA, PCR NEGATIVE NEGATIVE   Staphylococcus aureus NEGATIVE NEGATIVE  CBC WITH DIFFERENTIAL  Result Value Ref Range   WBC 13.3 (H) 4.0 - 10.5 K/uL   RBC 3.53 (L) 3.87 - 5.11 MIL/uL   Hemoglobin 10.8 (L) 12.0 - 15.0 g/dL   HCT 67.8 (L) 93.8 - 10.1 %   MCV 97.5 80.0 - 100.0 fL   MCH 30.6 26.0 - 34.0 pg   MCHC 31.4 30.0 - 36.0 g/dL   RDW 75.1 02.5 - 85.2 %   Platelets 222 150 - 400 K/uL   nRBC 0.0 0.0 - 0.2 %   Neutrophils Relative % 89 %   Neutro Abs 11.8 (H) 1.7 - 7.7 K/uL   Lymphocytes Relative 7 %   Lymphs Abs 1.0 0.7 - 4.0 K/uL   Monocytes Relative 3 %   Monocytes Absolute 0.4 0.1 - 1.0 K/uL   Eosinophils Relative 0 %   Eosinophils Absolute 0.0 0.0 - 0.5 K/uL    Basophils Relative 0 %   Basophils Absolute 0.0 0.0 - 0.1 K/uL   Immature Granulocytes 1 %   Abs Immature Granulocytes 0.10 (H) 0.00 - 0.07 K/uL  Type and screen  Result Value Ref Range   ABO/RH(D) A NEG    Antibody Screen NEG    Sample Expiration      09/12/2020,2359 Performed at [facility], [address] [place], [place] [address]     DG Lumbar Spine 2-3 Views  Result Date: 09/09/2020 CLINICAL DATA:  Surgery, elective Z41.9 (ICD-10-CM). Additional history provided by technologist: L4-5/5-S1 PLIF to ilium. Provided fluoroscopy time 41 seconds (23.21 mGy). EXAM: LUMBAR SPINE - 2-3 VIEW; DG C-ARM 1-60 MIN COMPARISON:  CT of the lumbar spine and pelvis 08/17/2020. FINDINGS: AP and lateral view intraoperative fluoroscopic images of the lumbosacral spine and pelvis are submitted, 2 images total. The lowest well-formed intervertebral disc space is designated L5-S1. On the provided images, bilateral pedicle screws are present at the L4, L5 and S1 levels. Screws are also present within the iliac bones bilaterally. Vertical interconnecting rods were not present at the time the images were taken. Interbody devices at L4-L5 and L5-S1. Overlying retractors. IMPRESSION: Two intraoperative fluoroscopic images of the lumbosacral spine and pelvis, as described. Correlate with the operative history. Electronically Signed   By: Jackey Loge D.O.   On: 09/09/2020 13:20  CT LUMBAR SPINE WO CONTRAST  Result Date: 08/18/2020 CLINICAL DATA:  PREOPERATIVE PLANNING EXAM: CT LUMBAR SPINE WITHOUT CONTRAST CT PELVIS WITHOUT CONTRAST TECHNIQUE: Multidetector CT imaging of the lumbar spine was performed without intravenous contrast administration. Multiplanar CT image reconstructions were also generated. Multidetector CT imaging of the pelvis was performed following the standard protocol without intravenous contrast. COMPARISON:  CT L-spine and pelvis 07/13/2020, MR L-spine 07/18/2013. FINDINGS: CT LUMBAR SPINE:  Segmentation: 5 normally formed lumbar vertebrae. Lowest fully formed disc space denoted L5-S1. Alignment: Grade 2 anterolisthesis L5 on S1 with 7.5 mm of anterior translation. No associated pars defect though there is chronic fragmentation along the superior articular facets of the S1 level with cortication. No other significant spondylolisthesis or spondylolysis. No acute vertebral body fracture or height loss. Discogenic and facet degenerative changes better detailed below. Vertebrae: Fragmentation along the superior articular facets at S1 with cortication suggesting a chronic process, unchanged from comparison prior. Additional discogenic and facet degenerative changes as detailed below. No acute fracture or traumatic listhesis. No suspicious lytic or blastic lesions. Paraspinal and other soft tissues: No paraspinal fluid, swelling, gas or hemorrhage. No visible canal hematoma or suspicious soft tissue abnormality within limitations of an unenhanced CT. Included portions of the lower chest and abdomen are degraded by motion imaging. Atelectatic changes in the lung bases, Three-vessel coronary artery atherosclerosis, aortoiliac atherosclerosis and vascular calcium versus nonobstructing calculi in the right kidney. Subcentimeter hypoattenuating focus in the posterior right lobe liver dome, too small to fully characterize on CT imaging but statistically likely benign. Pancolonic diverticulosis without evidence of acute diverticulitis at this time. Disc levels: Level by level evaluation of the lumbar spine below: T10-T11: Schmorl's node formation along the anterior inferior corner of T10. Disc height loss. Global disc bulge. No significant spinal canal or foraminal stenosis. T11-T12: The symmetric anterior disc bulging. Mild disc height loss. No significant spinal canal or foraminal stenosis. T12-L1: No significant posterior disc abnormality. No significant spinal canal or foraminal stenosis. L1-L2: No significant  posterior disc abnormality No significant spinal canal or foraminal stenosis. L2-L3: Mild global disc bulge and bilateral facet arthropathy. No significant canal stenosis. Mild bilateral foraminal narrowing. L3-L4: Global disc bulge and facet degenerative change. No significant canal stenosis. Mild bilateral foraminal narrowing. L4-L5: Asymmetric posterior disc bulge with superimposed right central to the extraforaminal disc protrusion. Bilateral facet arthropathy. Mild canal stenosis. Moderate right foraminal narrowing. Mild left foraminal narrowing. L5-S1: Anterolisthesis with uncovering of the disc and severe facet arthropathic changes including chronic fragmentation along the superior facets bilaterally. Moderate canal stenosis. Moderate bilateral foraminal narrowing as well as complete effacement of the lateral recesses and encroachment upon the traversing S1 nerve roots at this level. CT PELVIS: Urinary Tract: Kidneys are included on lumbar spine imaging demonstrating some renal sinus calcification on the right, possible vascular calcium or nonobstructing urolith. No hydronephrosis. No obstructive urolithiasis. Urinary bladder appears circumferentially thickened, greater than expected for underdistention. No visible bladder calculi or debris. Bowel: Hiatal hernia noted on lumbar spine imaging. No large or small bowel thickening or dilatation. Pancolonic diverticulosis without evidence of acute diverticulitis. No evidence of bowel obstruction. Vascular/Lymphatic: Aortoiliac atherosclerosis without aneurysm or ectasia. No visible concerning abdominopelvic adenopathy within limitations of this unenhanced CT. Reproductive: Anteverted uterus. Parametrial calcifications are typical in elderly patients. No concerning adnexal mass or lesion. Other: No abdominopelvic free fluid or free gas seen on lumbar spine or pelvic imaging. No bowel containing hernias. Musculoskeletal: Bones of the pelvis appear intact and  congruent. Mild bilateral SI joint arthrosis. Additional bilateral hip osteoarthrosis with periarticular spurring, subchondral sclerosis and cystic change, slightly more pronounced on the right than left. Minimal arthrosis at the symphysis pubis. No concerning lytic or blastic lesion. Findings included in lumbar spine better detailed above. IMPRESSION: Lumbar spine: No osseous abnormality. Chronic grade 2 anterolisthesis L5 on S1 with 7.5 mm of anterior translation. Resulting disc uncovering. Chronic fragmentation involving the superior articular facets S1 with advanced facet arthropathy resulting in moderate canal stenosis, severe bilateral foraminal narrowing and effacement of the lateral recesses with impingement upon the exiting and traversing nerve roots at this level. Additional moderate foraminal narrowing L4-5 with mild bilateral foraminal narrowing L2-L5. Pelvis: No acute traumatic osseous injury or suspicious osseous lesions of the bony pelvis. Mild arthrosis of the bilateral SI joints and symphysis pubis as well as the bilateral hips. Calcifications in the right renal pelvis, possibly vascular calcium versus nonobstructing urolith. Circumferential thickening of the urinary bladder greater than expected for underdistention. Correlate with urinalysis. Pancolonic diverticulosis without diverticulitis. Aortic Atherosclerosis (ICD10-I70.0). Electronically Signed   By: Kreg Shropshire M.D.   On: 08/18/2020 02:45   CT PELVIS WO CONTRAST  Result Date: 08/18/2020 CLINICAL DATA:  PREOPERATIVE PLANNING EXAM: CT LUMBAR SPINE WITHOUT CONTRAST CT PELVIS WITHOUT CONTRAST TECHNIQUE: Multidetector CT imaging of the lumbar spine was performed without intravenous contrast administration. Multiplanar CT image reconstructions were also generated. Multidetector CT imaging of the pelvis was performed following the standard protocol without intravenous contrast. COMPARISON:  CT L-spine and pelvis 07/13/2020, MR L-spine  07/18/2013. FINDINGS: CT LUMBAR SPINE: Segmentation: 5 normally formed lumbar vertebrae. Lowest fully formed disc space denoted L5-S1. Alignment: Grade 2 anterolisthesis L5 on S1 with 7.5 mm of anterior translation. No associated pars defect though there is chronic fragmentation along the superior articular facets of the S1 level with cortication. No other significant spondylolisthesis or spondylolysis. No acute vertebral body fracture or height loss. Discogenic and facet degenerative changes better detailed below. Vertebrae: Fragmentation along the superior articular facets at S1 with cortication suggesting a chronic process, unchanged from comparison prior. Additional discogenic and facet degenerative changes as detailed below. No acute fracture or traumatic listhesis. No suspicious lytic or blastic lesions. Paraspinal and other soft tissues: No paraspinal fluid, swelling, gas or hemorrhage. No visible canal hematoma or suspicious soft tissue abnormality within limitations of an unenhanced CT. Included portions of the lower chest and abdomen are degraded by motion imaging. Atelectatic changes in the lung bases, Three-vessel coronary artery atherosclerosis, aortoiliac atherosclerosis and vascular calcium versus nonobstructing calculi in the right kidney. Subcentimeter hypoattenuating focus in the posterior right lobe liver dome, too small to fully characterize on CT imaging but statistically likely benign. Pancolonic diverticulosis without evidence of acute diverticulitis at this time. Disc levels: Level by level evaluation of the lumbar spine below: T10-T11: Schmorl's node formation along the anterior inferior corner of T10. Disc height loss. Global disc bulge. No significant spinal canal or foraminal stenosis. T11-T12: The symmetric anterior disc bulging. Mild disc height loss. No significant spinal canal or foraminal stenosis. T12-L1: No significant posterior disc abnormality. No significant spinal canal or  foraminal stenosis. L1-L2: No significant posterior disc abnormality No significant spinal canal or foraminal stenosis. L2-L3: Mild global disc bulge and bilateral facet arthropathy. No significant canal stenosis. Mild bilateral foraminal narrowing. L3-L4: Global disc bulge and facet degenerative change. No significant canal stenosis. Mild bilateral foraminal narrowing. L4-L5: Asymmetric posterior disc bulge with superimposed right central to the extraforaminal disc protrusion. Bilateral  facet arthropathy. Mild canal stenosis. Moderate right foraminal narrowing. Mild left foraminal narrowing. L5-S1: Anterolisthesis with uncovering of the disc and severe facet arthropathic changes including chronic fragmentation along the superior facets bilaterally. Moderate canal stenosis. Moderate bilateral foraminal narrowing as well as complete effacement of the lateral recesses and encroachment upon the traversing S1 nerve roots at this level. CT PELVIS: Urinary Tract: Kidneys are included on lumbar spine imaging demonstrating some renal sinus calcification on the right, possible vascular calcium or nonobstructing urolith. No hydronephrosis. No obstructive urolithiasis. Urinary bladder appears circumferentially thickened, greater than expected for underdistention. No visible bladder calculi or debris. Bowel: Hiatal hernia noted on lumbar spine imaging. No large or small bowel thickening or dilatation. Pancolonic diverticulosis without evidence of acute diverticulitis. No evidence of bowel obstruction. Vascular/Lymphatic: Aortoiliac atherosclerosis without aneurysm or ectasia. No visible concerning abdominopelvic adenopathy within limitations of this unenhanced CT. Reproductive: Anteverted uterus. Parametrial calcifications are typical in elderly patients. No concerning adnexal mass or lesion. Other: No abdominopelvic free fluid or free gas seen on lumbar spine or pelvic imaging. No bowel containing hernias. Musculoskeletal:  Bones of the pelvis appear intact and congruent. Mild bilateral SI joint arthrosis. Additional bilateral hip osteoarthrosis with periarticular spurring, subchondral sclerosis and cystic change, slightly more pronounced on the right than left. Minimal arthrosis at the symphysis pubis. No concerning lytic or blastic lesion. Findings included in lumbar spine better detailed above. IMPRESSION: Lumbar spine: No osseous abnormality. Chronic grade 2 anterolisthesis L5 on S1 with 7.5 mm of anterior translation. Resulting disc uncovering. Chronic fragmentation involving the superior articular facets S1 with advanced facet arthropathy resulting in moderate canal stenosis, severe bilateral foraminal narrowing and effacement of the lateral recesses with impingement upon the exiting and traversing nerve roots at this level. Additional moderate foraminal narrowing L4-5 with mild bilateral foraminal narrowing L2-L5. Pelvis: No acute traumatic osseous injury or suspicious osseous lesions of the bony pelvis. Mild arthrosis of the bilateral SI joints and symphysis pubis as well as the bilateral hips. Calcifications in the right renal pelvis, possibly vascular calcium versus nonobstructing urolith. Circumferential thickening of the urinary bladder greater than expected for underdistention. Correlate with urinalysis. Pancolonic diverticulosis without diverticulitis. Aortic Atherosclerosis (ICD10-I70.0). Electronically Signed   By: Kreg ShropshirePrice  DeHay M.D.   On: 08/18/2020 02:45   DG C-Arm 1-60 Min  Result Date: 09/09/2020 CLINICAL DATA:  Surgery, elective Z41.9 (ICD-10-CM). Additional history provided by technologist: L4-5/5-S1 PLIF to ilium. Provided fluoroscopy time 41 seconds (23.21 mGy). EXAM: LUMBAR SPINE - 2-3 VIEW; DG C-ARM 1-60 MIN COMPARISON:  CT of the lumbar spine and pelvis 08/17/2020. FINDINGS: AP and lateral view intraoperative fluoroscopic images of the lumbosacral spine and pelvis are submitted, 2 images total. The lowest  well-formed intervertebral disc space is designated L5-S1. On the provided images, bilateral pedicle screws are present at the L4, L5 and S1 levels. Screws are also present within the iliac bones bilaterally. Vertical interconnecting rods were not present at the time the images were taken. Interbody devices at L4-L5 and L5-S1. Overlying retractors. IMPRESSION: Two intraoperative fluoroscopic images of the lumbosacral spine and pelvis, as described. Correlate with the operative history. Electronically Signed   By: Jackey LogeKyle  Golden D.O.   On: 09/09/2020 13:20    Antibiotics:  Anti-infectives (From admission, onward)    Start     Dose/Rate Route Frequency Ordered Stop   09/09/20 1430  ceFAZolin (ANCEF) IVPB 1 g/50 mL premix        1 g 100 mL/hr over 30 Minutes Intravenous  Every 8 hours 09/09/20 1429 09/09/20 2203   09/09/20 0851  vancomycin (VANCOCIN) powder  Status:  Discontinued          As needed 09/09/20 0851 09/09/20 1231   09/09/20 0645  ceFAZolin (ANCEF) IVPB 2g/100 mL premix        2 g 200 mL/hr over 30 Minutes Intravenous On call to O.R. 09/09/20 0641 09/09/20 1213       Discharge Exam: Blood pressure 123/81, pulse 89, temperature 99 F (37.2 C), temperature source Oral, resp. rate 18, height 5\' 4"  (1.626 m), weight 63.5 kg, SpO2 94 %. Neurologic: Grossly normal Ambulating and voiding well, incision cdi   Discharge Medications:   Allergies as of 09/10/2020   No Known Allergies      Medication List     TAKE these medications    amLODipine 10 MG tablet Commonly known as: NORVASC Take 1 tablet (10 mg total) by mouth daily.   diphenhydramine-acetaminophen 25-500 MG Tabs tablet Commonly known as: TYLENOL PM Take 1 tablet by mouth at bedtime as needed (sleep).   methocarbamol 500 MG tablet Commonly known as: Robaxin Take 1 tablet (500 mg total) by mouth 4 (four) times daily.   oxyCODONE-acetaminophen 5-325 MG tablet Commonly known as: Percocet Take 1 tablet by mouth every  4 (four) hours as needed for severe pain.   tetrahydrozoline-zinc 0.05-0.25 % ophthalmic solution Commonly known as: VISINE-AC Place 2 drops into both eyes daily as needed (dry eyes).   vitamin C 500 MG tablet Commonly known as: ASCORBIC ACID Take 500 mg by mouth daily.               Durable Medical Equipment  (From admission, onward)           Start     Ordered   09/09/20 1509  DME Walker rolling  Once       Question:  Patient needs a walker to treat with the following condition  Answer:  Degenerative spondylolisthesis   09/09/20 1508   09/09/20 1509  DME 3 n 1  Once        09/09/20 1508            Disposition: home   Final Dx: PLIF L4-5,L5-S1  Discharge Instructions      Remove dressing in 72 hours   Complete by: As directed    Call MD for:  difficulty breathing, headache or visual disturbances   Complete by: As directed    Call MD for:  hives   Complete by: As directed    Call MD for:  persistant dizziness or light-headedness   Complete by: As directed    Call MD for:  persistant nausea and vomiting   Complete by: As directed    Call MD for:  redness, tenderness, or signs of infection (pain, swelling, redness, odor or green/yellow discharge around incision site)   Complete by: As directed    Call MD for:  severe uncontrolled pain   Complete by: As directed    Call MD for:  temperature >100.4   Complete by: As directed    Diet - low sodium heart healthy   Complete by: As directed    Driving Restrictions   Complete by: As directed    No driving for 2 weeks, no riding in the car for 1 week   Increase activity slowly   Complete by: As directed    Lifting restrictions   Complete by: As directed    No lifting more than  8 lbs          Signed: Tiana Loft Milayna Rotenberg 09/10/2020, 8:16 AM

## 2020-09-10 NOTE — Progress Notes (Addendum)
Occupational Therapy Evaluation Patient Details Name: Elaine [name] MRN: [id number] DOB: 23-May-1960 Today's Date: 09/10/2020    History of Present Illness Per Dr. Jordan Likes H&P- "60 year old female with chronically progressive and now severe back and bilateral lower extremity pain failing conservative management.  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  Patient also with marked disc degeneration and broad-based disc protrusion and early stenosis at L4-5.  Patient presents now for two-level lumbar decompression and fusion in hopes of improving her symptoms." Patient underwent PLIF L4-5, L5-S1.   Clinical Impression   Prior to hospitalization, pt was living alone in a 1-level house with 4 STE (bilateral hand railings). Pt was independent with ADLs/ADL mobility/most IADLs without a mobility device. Pt did not drive. Pt's son no longer lives with pt full-time but can provide PRN S/A including transportation. Pt is retired.   Today, pt received sitting EOB with PT, pt agreeable to OT eval. Pt required min assist-min guard for functional mobility/transfers using RW within room (few rest breaks and cues for safety), min assist-SBA for LB self-care, and min assist-mod I for UB self-care. OT reviewed spinal precautions with pt (including handout), pt receptive of education. OT additionally educated pt on RW safety management, PLB, adaptive ADL strategies, and process of acute care OT/beyond. Per care team, pt will likely stay until Monday, therefore OT will follow pt acutely until d/c.     Follow Up Recommendations  Home health OT;Supervision/Assistance 12-24 hour initially   Equipment Recommendations  Other (comment) (rolling walker)    Recommendations for Other Services PT consult     Precautions / Restrictions Precautions Precautions: Back;Fall Precaution Booklet Issued: Yes (comment) Precaution Comments: pt  receptive Restrictions Weight Bearing Restrictions: No Other Position/Activity Restrictions: log rolling      Mobility Bed Mobility Overal bed mobility:  (N/A sitting EOB with PT upon arrival)             General bed mobility comments: sitting EOB with PT upon arrival    Transfers Overall transfer level: Needs assistance Equipment used: Rolling walker (2 wheeled) Transfers: Sit to/from Stand Sit to Stand: Min guard;Min assist         General transfer comment: min guard-min assist for functional mobility/transfers using RW 2/2 lower back pain    Balance Overall balance assessment: Needs assistance Sitting-balance support: Bilateral upper extremity supported;Feet supported Sitting balance-Leahy Scale: Good     Standing balance support: Bilateral upper extremity supported;During functional activity Standing balance-Leahy Scale: Fair Standing balance comment: reliant on RW       ADL either performed or assessed with clinical judgement   ADL Overall ADL's : Needs assistance/impaired Eating/Feeding: Independent;Sitting   Grooming: Wash/dry hands;Wash/dry face;Set up;Modified independent;Sitting   Upper Body Bathing: Minimal assistance;Sitting   Lower Body Bathing: Minimal assistance;Sitting/lateral leans;Sit to/from stand   Upper Body Dressing : Minimal assistance;Sitting   Lower Body Dressing: Modified independent;Supervision/safety;Minimal assistance;Sitting/lateral leans;Sit to/from stand (mod I for socks sitting in chair, likely min assist for underwear/pants)   Toilet Transfer: Minimal assistance;Cueing for sequencing;Cueing for safety;Ambulation;Comfort height toilet;Grab bars;RW   Toileting- Clothing Manipulation and Hygiene: Sitting/lateral lean;Sit to/from stand;Supervision/safety (for perineal care while sitting)   Tub/ Shower Transfer:  (NA)   Functional mobility during ADLs: Min guard;Minimal assistance;Rolling walker;Cueing for safety;Cueing for  sequencing General ADL Comments: fluctuating between min assist and min guard using RW for functional mobility/transfers and ADLs, slow moving 2/2 pain     Vision Baseline Vision/History: No visual  deficits Patient Visual Report: No change from baseline Vision Assessment?: No apparent visual deficits Additional Comments: readers only     Perception Perception Perception Tested?: No   Praxis Praxis Praxis tested?: Not tested    Pertinent Vitals/Pain Pain Assessment: 0-10 Pain Score: 8  Pain Location: back Pain Descriptors / Indicators: Aching;Moaning;Grimacing;Guarding Pain Intervention(s): Monitored during session;Repositioned    Hand Dominance Right   Extremity/Trunk Assessment Upper Extremity Assessment Upper Extremity Assessment: Overall WFL for tasks assessed   Lower Extremity Assessment Lower Extremity Assessment: Overall WFL for tasks assessed   Cervical / Trunk Assessment Cervical / Trunk Assessment: Normal   Communication Communication Communication: No difficulties   Cognition Arousal/Alertness: Awake/alert Behavior During Therapy: WFL for tasks assessed/performed Overall Cognitive Status: Within Functional Limits for tasks assessed         General Comments  incision site to back, no edema            Home Living Family/patient expects to be discharged to:: Private residence Living Arrangements: Alone Available Help at Discharge: Family;Available PRN/intermittently (son) Type of Home: House Home Access: Stairs to enter [employer] of Steps: 4 Entrance Stairs-Rails: Can reach both Home Layout: One level     Bathroom Shower/Tub: Chief Strategy Officer: Standard Bathroom Accessibility: Yes How Accessible: Accessible via walker Home Equipment: Tub bench          Prior Functioning/Environment Level of Independence: Independent        Comments: independent with ADLs/ADL mobility/most IADLs without a mobility device,  pt does not drive, son went to the grocery store for her, retired        OT Problem List: Decreased strength;Decreased activity tolerance;Impaired balance (sitting and/or standing);Decreased knowledge of use of DME or AE;Decreased knowledge of precautions;Pain      OT Treatment/Interventions: Other (comment) (N/A discharging today per care team)    OT Goals(Current goals can be found in the care plan section) Acute Rehab OT Goals Patient Stated Goal: return home with son OT Goal Formulation: With patient Time For Goal Achievement: 09/24/20 Potential to Achieve Goals: Good   OT Frequency: 2x/wk    AM-PAC OT "6 Clicks" Daily Activity     Outcome Measure Help from another person eating meals?: None Help from another person taking care of personal grooming?: A Little Help from another person toileting, which includes using toliet, bedpan, or urinal?: A Little Help from another person bathing (including washing, rinsing, drying)?: A Little Help from another person to put on and taking off regular upper body clothing?: A Little Help from another person to put on and taking off regular lower body clothing?: A Little 6 Click Score: 19   End of Session Equipment Utilized During Treatment: Rolling walker Nurse Communication: Mobility status;Other (comment) (discharge recs and DME needs)  Activity Tolerance: Patient limited by fatigue;Patient limited by pain Patient left: in chair;with call bell/phone within reach  OT Visit Diagnosis: Unsteadiness on feet (R26.81);Muscle weakness (generalized) (M62.81);Pain Pain - Right/Left:  (back) Pain - part of body:  (back)                Time: 5366-4403 OT Time Calculation (min): 25 min Charges:  OT General Charges $OT Visit: 1 Visit OT Evaluation $OT Eval Moderate Complexity: 1 Mod OT Treatments $Self Care/Home Management : 8-22 mins  Norris Cross, OTR/L [facility] Services [phone]   Mechele Claude 09/10/2020, 11:04 AM

## 2020-09-10 NOTE — Evaluation (Signed)
Physical Therapy Evaluation Patient Details Name: Elaine [name] MRN: [id number] DOB: 11-24-1960 Today's Date: 09/10/2020   History of Present Illness  Pt is a 60 y/o female who presents s/p L4-S1 PLIF on 09/09/2020. PMH significant for HTN.  Clinical Impression  Pt admitted with above diagnosis. At the time of PT eval, pt was able to demonstrate transfers and ambulation with up to min assist for balance support and recovery 2 RLE buckling. Pt was educated on precautions, brace application/wearing schedule, appropriate activity progression, and car transfer. Pt currently with functional limitations due to the deficits listed below (see PT Problem List). Pt will benefit from skilled PT to increase their independence and safety with mobility to allow discharge to the venue listed below.      Follow Up Recommendations Home health PT;Supervision for mobility/OOB    Equipment Recommendations  Rolling walker with 5" wheels    Recommendations for Other Services       Precautions / Restrictions Precautions Precautions: Back;Fall Precaution Booklet Issued: Yes (comment) Precaution Comments: Reviewed handout and pt was cued for precautions during functional mobility. Required Braces or Orthoses: Spinal Brace Spinal Brace: Lumbar corset (Not present during session) Restrictions Weight Bearing Restrictions: No Other Position/Activity Restrictions: log rolling      Mobility  Bed Mobility Overal bed mobility: Needs Assistance Bed Mobility: Rolling;Sit to Sidelying Rolling: Modified independent (Device/Increase time)       Sit to sidelying: Min guard General bed mobility comments: Min guard to elevate LE's up onto bed. HOB flat to simulate home environment.    Transfers Overall transfer level: Needs assistance Equipment used: Rolling walker (2 wheeled) Transfers: Sit to/from Stand Sit to Stand: Min guard;From elevated surface         General transfer comment: Min guard assist  from low recliner chair and EOB (minimally increased height). VC's to scoot out fully to EOB, bring LE's into a wide stance, and to get feet back underneath thighs to prepare to stand.  Ambulation/Gait Ambulation/Gait assistance: Min guard;Min assist Gait Distance (Feet): 150 Feet Assistive device: Rolling walker (2 wheeled) Gait Pattern/deviations: Step-through pattern;Decreased stride length;Trunk flexed;Narrow base of support Gait velocity: Decreased Gait velocity interpretation: <1.31 ft/sec, indicative of household ambulator General Gait Details: Very slow and guarded with occasional R knee buckle. Noted buckling increased in frequency as pt fatigued.  Stairs Stairs: Yes Stairs assistance: Min guard Stair Management: Two rails;Step to pattern;Forwards Number of Stairs: 4 General stair comments: VC's for sequencing and general safety. When leading up with the LLE and down with the RLE, pt managed stairs well without knee buckling or LOB. Hands on guarding for safety but no assist required.  Wheelchair Mobility    Modified Rankin (Stroke Patients Only)       Balance Overall balance assessment: Needs assistance Sitting-balance support: Bilateral upper extremity supported;Feet supported Sitting balance-Leahy Scale: Fair     Standing balance support: Bilateral upper extremity supported;During functional activity Standing balance-Leahy Scale: Poor Standing balance comment: reliant on RW                             Pertinent Vitals/Pain Pain Assessment: Faces Pain Score: 8  Faces Pain Scale: Hurts whole lot Pain Location: back Pain Descriptors / Indicators: Aching;Moaning;Grimacing;Operative site guarding Pain Intervention(s): Limited activity within patient's tolerance;Monitored during session;Repositioned    Home Living Family/patient expects to be discharged to:: Private residence Living Arrangements: Alone Available Help at Discharge: Family;Available  PRN/intermittently (son) Type of Home:  House Home Access: Stairs to enter Entrance Stairs-Rails: Right;Left;Can reach both Secretary/administrator of Steps: 4 Home Layout: One level Home Equipment: Tub bench      Prior Function Level of Independence: Independent         Comments: independent with ADLs/ADL mobility/most IADLs without a mobility device, pt does not drive, son went to the grocery store for her, retired     Higher education careers adviser Dominance   Dominant Hand: Right    Extremity/Trunk Assessment   Upper Extremity Assessment Upper Extremity Assessment: Defer to OT evaluation    Lower Extremity Assessment Lower Extremity Assessment: Generalized weakness;RLE deficits/detail RLE Deficits / Details: Pt reports yesterday LLE was numb but today the LLE feels normal from a sensation standpoint, and the RLE is weak (knee buckling observed during ambulation) and the foot feels numb, like she is standing on a ball.    Cervical / Trunk Assessment Cervical / Trunk Assessment: Other exceptions Cervical / Trunk Exceptions: s/p surgery  Communication   Communication: No difficulties  Cognition Arousal/Alertness: Awake/alert Behavior During Therapy: WFL for tasks assessed/performed Overall Cognitive Status: Within Functional Limits for tasks assessed                                 General Comments: Slow to respond at times      General Comments General comments (skin integrity, edema, etc.): incision site to back, no edema    Exercises     Assessment/Plan    PT Assessment Patient needs continued PT services  PT Problem List Decreased strength;Decreased activity tolerance;Decreased balance;Decreased mobility;Decreased knowledge of use of DME;Decreased safety awareness;Decreased knowledge of precautions;Pain       PT Treatment Interventions DME instruction;Gait training;Stair training;Functional mobility training;Therapeutic activities;Therapeutic exercise;Neuromuscular  re-education;Patient/family education    PT Goals (Current goals can be found in the Care Plan section)  Acute Rehab PT Goals Patient Stated Goal: return home with son PT Goal Formulation: With patient Time For Goal Achievement: 09/17/20 Potential to Achieve Goals: Good    Frequency Min 5X/week   Barriers to discharge Decreased caregiver support Pt reports her son will be there intermittently for support    Co-evaluation               AM-PAC PT "6 Clicks" Mobility  Outcome Measure Help needed turning from your back to your side while in a flat bed without using bedrails?: None Help needed moving from lying on your back to sitting on the side of a flat bed without using bedrails?: A Little Help needed moving to and from a bed to a chair (including a wheelchair)?: A Little Help needed standing up from a chair using your arms (e.g., wheelchair or bedside chair)?: A Little Help needed to walk in hospital room?: A Little Help needed climbing 3-5 steps with a railing? : A Little 6 Click Score: 19    End of Session Equipment Utilized During Treatment: Gait belt Activity Tolerance: Patient limited by pain;Patient limited by fatigue Patient left: in bed;with call bell/phone within reach Nurse Communication: Mobility status PT Visit Diagnosis: Unsteadiness on feet (R26.81);Pain Pain - Right/Left: Right Pain - part of body:  (back)    Time: 3329-5188 PT Time Calculation (min) (ACUTE ONLY): 54 min   Charges:   PT Evaluation $PT Eval Low Complexity: 1 Low PT Treatments $Gait Training: 23-37 mins $Therapeutic Activity: 8-22 mins        Conni Slipper, PT, DPT Acute Rehabilitation  Services Pager: [phone] Office: [phone]   Marylynn Pearson 09/10/2020, 1:15 PM

## 2020-09-11 NOTE — Progress Notes (Signed)
Paged Ortho Tech to request back brace for patient per PT. Ortho tech is saying that the order is complete on 09/09/20, pt has brace at home, not here son is suppose to bring it, and states it is not comfortable. Pt needs brace to feel more confident with moving.

## 2020-09-11 NOTE — Progress Notes (Signed)
Orthopedic Tech Progress Note Patient Details:  Elaine [name] February 22, 1960 [id number]  Fit Pt with new LSO, removed it after fitting as it was causing an increased amount of discomfort, and placed at bedside to use when ambulating.  Ortho Devices Type of Ortho Device: Lumbar corsett Ortho Device/Splint Location: at bedside Ortho Device/Splint Interventions: Ordered, Adjustment   Post Interventions Patient Tolerated: Fair Instructions Provided: Care of device, Adjustment of device  Jhaden Pizzuto Carmine Savoy 09/11/2020, 2:09 PM

## 2020-09-11 NOTE — Progress Notes (Addendum)
Occupational Therapy Treatment Patient Details Name: Elaine [name] MRN: [id number] DOB: Apr 19, 1960 Today's Date: 09/11/2020    History of present illness Pt is a 60 y/o female who presents s/p L4-S1 PLIF on 09/09/2020. PMH significant for HTN.   OT comments  Pt. Seen for skilled OT session. Found seated eob stating "I gotta go I gotta get home".  Provided comfort and reassurance as pt. Was notably upset and tearful. Continued to talk about not wanting to rely on others and upset and worried about having to have some assistance and support from her son.  Multiple attempts to console and redirect to task.  Min guard a for most sit/stands with cues for sequencing.  Able to complete toileting task with bsc and in room mobility to recliner.  Noted a squeaky/barking sound during breathing Rn notified.     Follow Up Recommendations  Home health OT;Supervision/Assistance - 24 hour    Equipment Recommendations       Recommendations for Other Services      Precautions / Restrictions Precautions Precautions: Back;Fall Precaution Comments: Reviewed handout and pt was cued for precautions during functional mobility. Required Braces or Orthoses:  (brace not present in room during session)       Mobility Bed Mobility               General bed mobility comments: seated eob upon arrival into room saying "i have to get out of here i have to leave"    Transfers Overall transfer level: Needs assistance Equipment used: Rolling walker (2 wheeled) Transfers: Sit to/from [employer] Sit to Stand: Min guard Stand pivot transfers: Min guard       General transfer comment: min guard from eob, min guard from bsc, ambulated to recliner across the room. moves slow-cues to relax shoulders and breathe    Balance                                           ADL either performed or assessed with clinical judgement   ADL Overall ADL's : Needs assistance/impaired                          Toilet Transfer: Minimal assistance;BSC;RW;Stand-pivot   Toileting- Clothing Manipulation and Hygiene: Min guard;Sit to/from stand       Functional mobility during ADLs: Min guard;Minimal assistance;Rolling walker;Cueing for safety;Cueing for sequencing General ADL Comments: fluctuating between min assist and min guard using RW for functional mobility/transfers and ADLs, slow moving.  tearful, crying throughout session, repeating concerns for d/c and having to depend on people. attempts to console and redirect     Vision       Perception     Praxis      Cognition Arousal/Alertness: Awake/alert Behavior During Therapy:  (tearful and upset) Overall Cognitive Status: Within Functional Limits for tasks assessed                                          Exercises     Shoulder Instructions       General Comments      Pertinent Vitals/ Pain       Pain Assessment: 0-10 Pain Score: 8  Pain Location: back Pain Descriptors / Indicators: Aching;Moaning;Grimacing;Operative site guarding Pain  Intervention(s): Limited activity within patient's tolerance;Monitored during session;Repositioned  Home Living                                          Prior Functioning/Environment              Frequency  Min 2X/week        Progress Toward Goals  OT Goals(current goals can now be found in the care plan section)  Progress towards OT goals: Progressing toward goals     Plan Discharge plan remains appropriate    Co-evaluation                 AM-PAC OT "6 Clicks" Daily Activity     Outcome Measure   Help from another person eating meals?: None Help from another person taking care of personal grooming?: A Little Help from another person toileting, which includes using toliet, bedpan, or urinal?: A Little Help from another person bathing (including washing, rinsing, drying)?: A Little Help from  another person to put on and taking off regular upper body clothing?: A Little Help from another person to put on and taking off regular lower body clothing?: A Little 6 Click Score: 19    End of Session Equipment Utilized During Treatment: Rolling walker  OT Visit Diagnosis: Unsteadiness on feet (R26.81);Muscle weakness (generalized) (M62.81);Pain   Activity Tolerance Patient tolerated treatment well   Patient Left in chair;with call bell/phone within reach   Nurse Communication   Spoke with rn regarding pts. Tearful disposition throughout session along with the squeaky sounding breathing       Time: 3151-7616 OT Time Calculation (min): 21 min  Charges: OT General Charges $OT Visit: 1 Visit OT Treatments $Self Care/Home Management : 8-22 mins  Boneta Lucks, COTA/L Acute Rehabilitation [phone]    Salvadore Oxford 09/11/2020, 8:33 AM

## 2020-09-11 NOTE — Progress Notes (Signed)
Physical Therapy reported that the patient is very tearful this morning and seem to be anxious. PT stated that patient would calm down for a little but then revert back to the tears and worried about going home and placing to much on her son.  Went to see the patient she up in th chair, ask if she in any pain, stated 7 out 10 in her back. Patient appears to be worried, ask her what is going on, pt stated that she worried about her son and his situation, then the PA walk in. Reinforce encouragement to give patient another day with PT and walk the hall today and possible d/c 09/12/20.

## 2020-09-11 NOTE — Progress Notes (Signed)
Physical Therapy Treatment Patient Details Name: Elaine [name] MRN: [id number] DOB: Dec 30, 1960 Today's Date: 09/11/2020    History of Present Illness Pt is a 60 y/o female who presents s/p L4-S1 PLIF on 09/09/2020. PMH significant for HTN.    PT Comments    Continuing work on functional mobility and activity tolerance;  Session focused on progressive ambulation; Noted she had been quite emotional earlier today; during session, pt was reserved, but participated well; Able to walk the hallway, but not as far as yesterday due to pain; Plan to review stair training tomorrow   Follow Up Recommendations  Home health PT;Supervision for mobility/OOB     Equipment Recommendations  Rolling walker with 5" wheels    Recommendations for Other Services       Precautions / Restrictions Precautions Precautions: Back;Fall Precaution Booklet Issued: Yes (comment) Precaution Comments: Reviewed handout and pt was cued for precautions during functional mobility. Required Braces or Orthoses:  (brace not present in room during session) Spinal Brace: Lumbar corset (Not present during session) Restrictions Other Position/Activity Restrictions: log rolling    Mobility  Bed Mobility Overal bed mobility: Needs Assistance Bed Mobility: Rolling;Sidelying to Sit Rolling: Modified independent (Device/Increase time) Sidelying to sit: Min guard       General bed mobility comments: Cues for technique    Transfers Overall transfer level: Needs assistance Equipment used: Rolling walker (2 wheeled) Transfers: Sit to/from [employer] Sit to Stand: Min guard         General transfer comment: Minguard from EOB; tends to bend forward at hips, switch hands to RW, and push up from there  Ambulation/Gait Ambulation/Gait assistance: Min guard Gait Distance (Feet): 100 Feet Assistive device: Rolling walker (2 wheeled) Gait Pattern/deviations: Step-through pattern;Decreased stride  length;Trunk flexed;Narrow base of support Gait velocity: Decreased   General Gait Details: Very slow and guarded with occasional R knee buckle. Noted buckling increased in frequency as pt fatigued.   Stairs             Wheelchair Mobility    Modified Rankin (Stroke Patients Only)       Balance     Sitting balance-Leahy Scale: Fair       Standing balance-Leahy Scale: Poor Standing balance comment: reliant on RW                            Cognition Arousal/Alertness: Awake/alert Behavior During Therapy: Flat affect Overall Cognitive Status: Within Functional Limits for tasks assessed                                 General Comments: Slow to respond at times      Exercises      General Comments General comments (skin integrity, edema, etc.): Pt tells me her back brace is at home, but she doesn't like it, because it hurts her; her brace hasn't been brought in yet, so asked her nurse, Tameika, to contact [facility] [employer] and have one delivered to her room      Pertinent Vitals/Pain Pain Assessment: 0-10 Pain Score: 7  Pain Location: back Pain Descriptors / Indicators: Aching;Moaning;Grimacing;Operative site guarding Pain Intervention(s): Limited activity within patient's tolerance;Monitored during session    Home Living                      Prior Function  PT Goals (current goals can now be found in the care plan section) Acute Rehab PT Goals Patient Stated Goal: return home with son PT Goal Formulation: With patient Time For Goal Achievement: 09/17/20 Potential to Achieve Goals: Good Progress towards PT goals: Progressing toward goals (slowly)    Frequency    Min 5X/week      PT Plan Current plan remains appropriate    Co-evaluation              AM-PAC PT "6 Clicks" Mobility   Outcome Measure  Help needed turning from your back to your side while in a flat bed without using bedrails?:  None Help needed moving from lying on your back to sitting on the side of a flat bed without using bedrails?: A Little Help needed moving to and from a bed to a chair (including a wheelchair)?: A Little Help needed standing up from a chair using your arms (e.g., wheelchair or bedside chair)?: A Little Help needed to walk in hospital room?: A Little Help needed climbing 3-5 steps with a railing? : A Little 6 Click Score: 19    End of Session Equipment Utilized During Treatment: Gait belt Activity Tolerance: Patient limited by pain;Patient limited by fatigue Patient left: in chair;with call bell/phone within reach Nurse Communication: Mobility status PT Visit Diagnosis: Unsteadiness on feet (R26.81);Pain Pain - Right/Left: Right Pain - part of body:  (back)     Time: 2426-8341 PT Time Calculation (min) (ACUTE ONLY): 32 min  Charges:  $Gait Training: 8-22 mins $Therapeutic Activity: 8-22 mins                     Van Clines, PT  Acute Rehabilitation Services Pager [phone] Office [phone]    Levi Aland 09/11/2020, 4:35 PM

## 2020-09-11 NOTE — Progress Notes (Signed)
Subjective: Patient reports moderate back pain, no acute events over night. Very tearful this morning- fear of going home and putting too much work on her son   Objective: Vital signs in last 24 hours: Temp:  [97.7 F (36.5 C)-98.7 F (37.1 C)] 98.5 F (36.9 C) (08/21 0442) Pulse Rate:  [69-98] 93 (08/21 0442) Resp:  [17-20] 17 (08/21 0442) BP: (103-140)/(62-71) 103/62 (08/21 0442) SpO2:  [92 %-100 %] 100 % (08/21 0442)  Intake/Output from previous day: 08/20 0701 - 08/21 0700 In: 250 [P.O.:250] Out: 250 [Urine:250] Intake/Output this shift: No intake/output data recorded.  Neurologic: Grossly normal  Lab Results: Lab Results  Component Value Date   WBC 13.3 (H) 09/09/2020   HGB 10.8 (L) 09/09/2020   HCT 34.4 (L) 09/09/2020   MCV 97.5 09/09/2020   PLT 222 09/09/2020   No results found for: INR, PROTIME BMET Lab Results  Component Value Date   NA 138 08/17/2020   K 3.7 08/17/2020   CL 106 08/17/2020   CO2 24 08/17/2020   GLUCOSE 109 (H) 08/17/2020   BUN 18 08/17/2020   CREATININE 1.70 (H) 08/17/2020   CALCIUM 9.1 08/17/2020    Studies/Results: DG Lumbar Spine 2-3 Views  Result Date: 09/09/2020 CLINICAL DATA:  Surgery, elective Z41.9 (ICD-10-CM). Additional history provided by technologist: L4-5/5-S1 PLIF to ilium. Provided fluoroscopy time 41 seconds (23.21 mGy). EXAM: LUMBAR SPINE - 2-3 VIEW; DG C-ARM 1-60 MIN COMPARISON:  CT of the lumbar spine and pelvis 08/17/2020. FINDINGS: AP and lateral view intraoperative fluoroscopic images of the lumbosacral spine and pelvis are submitted, 2 images total. The lowest well-formed intervertebral disc space is designated L5-S1. On the provided images, bilateral pedicle screws are present at the L4, L5 and S1 levels. Screws are also present within the iliac bones bilaterally. Vertical interconnecting rods were not present at the time the images were taken. Interbody devices at L4-L5 and L5-S1. Overlying retractors. IMPRESSION: Two  intraoperative fluoroscopic images of the lumbosacral spine and pelvis, as described. Correlate with the operative history. Electronically Signed   By: Jackey Loge D.O.   On: 09/09/2020 13:20   DG C-Arm 1-60 Min  Result Date: 09/09/2020 CLINICAL DATA:  Surgery, elective Z41.9 (ICD-10-CM). Additional history provided by technologist: L4-5/5-S1 PLIF to ilium. Provided fluoroscopy time 41 seconds (23.21 mGy). EXAM: LUMBAR SPINE - 2-3 VIEW; DG C-ARM 1-60 MIN COMPARISON:  CT of the lumbar spine and pelvis 08/17/2020. FINDINGS: AP and lateral view intraoperative fluoroscopic images of the lumbosacral spine and pelvis are submitted, 2 images total. The lowest well-formed intervertebral disc space is designated L5-S1. On the provided images, bilateral pedicle screws are present at the L4, L5 and S1 levels. Screws are also present within the iliac bones bilaterally. Vertical interconnecting rods were not present at the time the images were taken. Interbody devices at L4-L5 and L5-S1. Overlying retractors. IMPRESSION: Two intraoperative fluoroscopic images of the lumbosacral spine and pelvis, as described. Correlate with the operative history. Electronically Signed   By: Jackey Loge D.O.   On: 09/09/2020 13:20    Assessment/Plan: S/p lumbar fusion. Continue to mobilize today. She is fearful of going home since she lives by herself. We will give her one more day of therapy and stair training and likely home tomorrow.    LOS: 2 days    Elaine [name] [facility] 09/11/2020, 8:53 AM

## 2020-09-12 ENCOUNTER — Encounter (HOSPITAL_COMMUNITY): Payer: Self-pay | Admitting: Neurosurgery

## 2020-09-12 NOTE — Progress Notes (Signed)
Occupational Therapy Treatment Patient Details Name: Elaine [name] MRN: [id number] DOB: 07-10-1960 Today's Date: 09/12/2020    History of present illness 60 y/o female who presents s/p L4-S1 PLIF on 09/09/2020. PMH significant for HTN.   OT comments  Pt progressing towards established OT goals. Pt moving slowly and cautiously with increased pain. Pt performing oral care at sink with Min Guard A; providing education on compensatory techniques to prevent bending. Pt performing functional mobility with Min guard A and RW. Attempting figure four; pt unable to perform figure four and may benefit from AE education. Continue to recommend dc with HHOT and will continue to follow acutely as admitted.  Pt tearful during session when discussing going home. Agreeable to have chaplin visit. Notified RN.    Follow Up Recommendations  Home health OT;Supervision/Assistance - 24 hour    Equipment Recommendations  3 in 1 bedside commode (RW)    Recommendations for Other Services PT consult    Precautions / Restrictions Precautions Precautions: Back;Fall Precaution Booklet Issued: Yes (comment) Precaution Comments: Pt recalling 2/3 back precautions with Min visual cues. Not recalling "no twisting" Required Braces or Orthoses: Spinal Brace Spinal Brace: Lumbar corset;Applied in sitting position       Mobility Bed Mobility Overal bed mobility: Needs Assistance Bed Mobility: Rolling;Sidelying to Sit Rolling: Min guard Sidelying to sit: Min guard       General bed mobility comments: Pt attempting to get out of bed without log roll. Providing Mod verbal cues for use of log roll and MIn Guard A for maintaining position    Transfers Overall transfer level: Needs assistance Equipment used: Rolling walker (2 wheeled) Transfers: Sit to/from [employer] Sit to Stand: Min guard         General transfer comment: Min Guard A for safety    Balance Overall balance assessment:  Needs assistance Sitting-balance support: No upper extremity supported;Feet supported Sitting balance-Leahy Scale: Fair     Standing balance support: During functional activity;Bilateral upper extremity supported;Single extremity supported Standing balance-Leahy Scale: Poor Standing balance comment: Reliant on UE support; one UE at sink during oral care                           ADL either performed or assessed with clinical judgement   ADL Overall ADL's : Needs assistance/impaired     Grooming: Oral care;Min guard;Standing Grooming Details (indicate cue type and reason): Providing education on compensatory techniques for oral care at sink. Pt demosntrating understanding and performing oral care with MIn guard A for safety         Upper Body Dressing : Minimal assistance;Sitting Upper Body Dressing Details (indicate cue type and reason): Min A for positioning of brace. Pt donning with cues for sequencing   Lower Body Dressing Details (indicate cue type and reason): Pt unable to perform figure four position; review AE Toilet Transfer: Min guard;Ambulation;RW (simulated to recliner) Toilet Transfer Details (indicate cue type and reason): Min guard A for safety         Functional mobility during ADLs: Min guard;Rolling walker General ADL Comments: Pt performing oral care and functional mobility. Attempting figure four position and pt unable to bring ankle very far.     Vision       Perception     Praxis      Cognition Arousal/Alertness: Awake/alert Behavior During Therapy: Flat affect (tearful) Overall Cognitive Status: Within Functional Limits for tasks assessed  General Comments: Pt agreeable to therapy and verablizing understanding of need for OOB activity. Tearful when discussing dc and pt reporting she just wants to go home.        Exercises     Shoulder Instructions       General Comments Tearful  with topic of home. Agreeable to have chaplin come visit and talk abotu it    Pertinent Vitals/ Pain       Pain Assessment: 0-10 Pain Score: 7  Pain Location: back Pain Descriptors / Indicators: Aching;Moaning;Grimacing;Operative site guarding Pain Intervention(s): Monitored during session;Limited activity within patient's tolerance;Repositioned  Home Living                                          Prior Functioning/Environment              Frequency  Min 2X/week        Progress Toward Goals  OT Goals(current goals can now be found in the care plan section)  Progress towards OT goals: Progressing toward goals  Acute Rehab OT Goals Patient Stated Goal: return home with son OT Goal Formulation: With patient Time For Goal Achievement: 09/24/20 Potential to Achieve Goals: Good ADL Goals Pt Will Perform Grooming: with modified independence;standing Pt Will Perform Upper Body Bathing: with modified independence;sitting;standing Pt Will Perform Lower Body Bathing: with modified independence;sitting/lateral leans;sit to/from stand Pt Will Perform Upper Body Dressing: with modified independence;sitting;standing Pt Will Perform Lower Body Dressing: with modified independence;with adaptive equipment;sitting/lateral leans;sit to/from stand Pt Will Transfer to Toilet: with modified independence;ambulating;grab bars;regular height toilet Pt Will Perform Toileting - Clothing Manipulation and hygiene: with modified independence;sitting/lateral leans;sit to/from stand Pt Will Perform Tub/Shower Transfer: Shower transfer;Tub transfer;ambulating;shower seat;grab bars;rolling walker Additional ADL Goal #1: Pt will verbalize/demonstrate 3 fall prevention strategies to incorporate into ADLs/ADL mobility to increase safety awareness with mod I overall. Additional ADL Goal #2: Pt will verbalize/demonstrate spinal precautions and log rolling with 100% accuracy in order to safely  participate in ADLs/ADL mobility with mod I overall.  Plan Discharge plan remains appropriate    Co-evaluation                 AM-PAC OT "6 Clicks" Daily Activity     Outcome Measure   Help from another person eating meals?: None Help from another person taking care of personal grooming?: A Little Help from another person toileting, which includes using toliet, bedpan, or urinal?: A Little Help from another person bathing (including washing, rinsing, drying)?: A Little Help from another person to put on and taking off regular upper body clothing?: A Little Help from another person to put on and taking off regular lower body clothing?: A Little 6 Click Score: 19    End of Session Equipment Utilized During Treatment: Rolling walker;Back brace  OT Visit Diagnosis: Unsteadiness on feet (R26.81);Muscle weakness (generalized) (M62.81);Pain   Activity Tolerance Patient tolerated treatment well   Patient Left in chair;with call bell/phone within reach   Nurse Communication Mobility status;Other (comment)        Time: 9794-8016 OT Time Calculation (min): 21 min  Charges: OT General Charges $OT Visit: 1 Visit OT Treatments $Self Care/Home Management : 8-22 mins  Lunabelle Oatley MSOT, OTR/L Acute Rehab Pager: [phone] Office: [phone]   Theodoro Grist Oakleigh Hesketh 09/12/2020, 10:55 AM

## 2020-09-12 NOTE — Progress Notes (Signed)
Physical Therapy Treatment Patient Details Name: Elaine [name] MRN: [id number] DOB: Jan 26, 1960 Today's Date: 09/12/2020    History of Present Illness 60 y/o female who presents s/p L4-S1 PLIF on 09/09/2020. PMH significant for HTN.    PT Comments    Pt supine in bed on arrival this session.  Pt continues to benefit from skilled rehab in home environment.  Focused on stair training but unable to ambulate to stair well due to pain so brought stair to room.      Follow Up Recommendations  Home health PT;Supervision for mobility/OOB     Equipment Recommendations  Rolling walker with 5" wheels    Recommendations for Other Services       Precautions / Restrictions Precautions Precautions: Back;Fall Precaution Booklet Issued: Yes (comment) Precaution Comments: Pt recalling 2/3 back precautions with Min visual cues. Not recalling "no twisting"  Cues to recall no lifting and VCs to avoid twisting. Required Braces or Orthoses: Spinal Brace Spinal Brace: Lumbar corset;Applied in sitting position Restrictions Weight Bearing Restrictions: No Other Position/Activity Restrictions: log rolling    Mobility  Bed Mobility Overal bed mobility: Needs Assistance Bed Mobility: Rolling;Sidelying to Sit Rolling: Min guard Sidelying to sit: Min guard     Sit to sidelying: Min assist General bed mobility comments: Increased time and effort with cues for log rolling as she kept attempting to reach behind her causing twisting.  To return back to bed min assistance to lift LEs back to bed against gravity.  She continues to attempt to reach behind her causing twisting.  She remains very painful.    Transfers Overall transfer level: Needs assistance Equipment used: Rolling walker (2 wheeled) Transfers: Sit to/from Stand Sit to Stand: Min guard Stand pivot transfers: Min guard       General transfer comment: Increased time and effort to rise into standing.  Used RW and performed pivot to [facility]  and back to bed.  Ambulation/Gait Ambulation/Gait assistance: Min guard Gait Distance (Feet): 16 Feet (in room to ambulate to stair for stair training.) Assistive device: Rolling walker (2 wheeled) Gait Pattern/deviations: Step-through pattern;Decreased stride length;Trunk flexed;Narrow base of support Gait velocity: Decreased   General Gait Details: Gt remains slow and guarded. Constant cues for posture and to keep RW close to her person.   Stairs Stairs: Yes Stairs assistance: Min guard Stair Management: Two rails;Step to pattern;Forwards Number of Stairs: 4 General stair comments: Performed x 4 reps of step up to 6inch stair.  Forward to ascend and backward to descend.   Wheelchair Mobility    Modified Rankin (Stroke Patients Only)       Balance Overall balance assessment: Needs assistance Sitting-balance support: No upper extremity supported;Feet supported Sitting balance-Leahy Scale: Fair       Standing balance-Leahy Scale: Poor Standing balance comment: Reliant on UE support; one UE at sink during oral care                            Cognition Arousal/Alertness: Awake/alert Behavior During Therapy: Flat affect Overall Cognitive Status: Within Functional Limits for tasks assessed                                 General Comments: Pt remains flat but agreeable to PT session.  Very limited due to pain.      Exercises      General Comments  Pertinent Vitals/Pain Pain Assessment: 0-10 Pain Score: 9  Pain Location: back Pain Descriptors / Indicators: Aching;Moaning;Grimacing;Operative site guarding Pain Intervention(s): Monitored during session;Repositioned    Home Living                      Prior Function            PT Goals (current goals can now be found in the care plan section) Acute Rehab PT Goals Patient Stated Goal: return home with son Potential to Achieve Goals: Good Progress towards PT goals:  Progressing toward goals    Frequency    Min 5X/week      PT Plan Current plan remains appropriate    Co-evaluation              AM-PAC PT "6 Clicks" Mobility   Outcome Measure  Help needed turning from your back to your side while in a flat bed without using bedrails?: None Help needed moving from lying on your back to sitting on the side of a flat bed without using bedrails?: A Little Help needed moving to and from a bed to a chair (including a wheelchair)?: A Little Help needed standing up from a chair using your arms (e.g., wheelchair or bedside chair)?: A Little Help needed to walk in hospital room?: A Little Help needed climbing 3-5 steps with a railing? : A Little 6 Click Score: 19    End of Session Equipment Utilized During Treatment: Gait belt Activity Tolerance: Patient limited by pain;Patient limited by fatigue Patient left: in chair;with call bell/phone within reach;with chair alarm set Nurse Communication: Mobility status PT Visit Diagnosis: Unsteadiness on feet (R26.81);Pain Pain - Right/Left: Right Pain - part of body:  (back)     Time: 8756-4332 PT Time Calculation (min) (ACUTE ONLY): 20 min  Charges:  $Gait Training: 8-22 mins                     Bonney Leitz , PTA Acute Rehabilitation Services Pager [phone] Office [phone]    Burt Piatek Artis Delay 09/12/2020, 6:00 PM

## 2020-09-12 NOTE — Discharge Instructions (Signed)

## 2020-09-12 NOTE — Anesthesia Postprocedure Evaluation (Signed)
Anesthesia Post Note  Patient: Elenora Martello  Procedure(s) Performed: Posterior Lumbar Interbody Fusion - Lumbar four-five, Lumbar five-Sacral one, Sacral two Alar iliac screws with Mazor (Back) APPLICATION OF ROBOTIC ASSISTANCE FOR SPINAL PROCEDURE APPLICATION OF CELL SAVER     Patient location during evaluation: PACU Anesthesia Type: General Level of consciousness: awake and alert Pain management: pain level controlled Vital Signs Assessment: post-procedure vital signs reviewed and stable Respiratory status: spontaneous breathing, nonlabored ventilation, respiratory function stable and patient connected to nasal cannula oxygen Cardiovascular status: blood pressure returned to baseline and stable Postop Assessment: no apparent nausea or vomiting Anesthetic complications: no   No notable events documented.  Last Vitals:  Vitals:   09/12/20 0810 09/12/20 1100  BP: 117/72 (!) 121/55  Pulse: 79 94  Resp: 16 18  Temp: 37 C 36.9 C  SpO2: 98% 97%    Last Pain:  Vitals:   09/12/20 1400  TempSrc:   PainSc: 4                  Kennieth Rad

## 2020-09-12 NOTE — Discharge Summary (Signed)
Physician Discharge Summary     Providing Compassionate, Quality Care - Together   Patient ID: Elaine [name] MRN: [id number] DOB/AGE: Aug 26, 1960 60 y.o.  Admit date: 09/09/2020 Discharge date: 09/12/2020  Admission Diagnoses: Spondylolisthesis at L5-S1 level  Discharge Diagnoses:  Active Problems:   Spondylolisthesis at L5-S1 level   Discharged Condition: good  Hospital Course: Patient underwent an L4-5, L5-S1 PLIF by Dr. Jordan Likes on 09/09/2020. She was admitted following recovery from anesthesia in the PACU. Her postoperative course was complicated by pain and mobility issues. She has worked with both physical and occupational therapies who feel the patient is ready for discharge home with Home Health. She is ambulating independently and without difficulty. She is tolerating a normal diet. She is not having any bowel or bladder dysfunction. Her pain is reasonably controlled with oral pain medication. She is ready for discharge home.   Consults: rehabilitation medicine  Significant Diagnostic Studies: radiology: DG Lumbar Spine 2-3 Views  Result Date: 09/09/2020 CLINICAL DATA:  Surgery, elective Z41.9 (ICD-10-CM). Additional history provided by technologist: L4-5/5-S1 PLIF to ilium. Provided fluoroscopy time 41 seconds (23.21 mGy). EXAM: LUMBAR SPINE - 2-3 VIEW; DG C-ARM 1-60 MIN COMPARISON:  CT of the lumbar spine and pelvis 08/17/2020. FINDINGS: AP and lateral view intraoperative fluoroscopic images of the lumbosacral spine and pelvis are submitted, 2 images total. The lowest well-formed intervertebral disc space is designated L5-S1. On the provided images, bilateral pedicle screws are present at the L4, L5 and S1 levels. Screws are also present within the iliac bones bilaterally. Vertical interconnecting rods were not present at the time the images were taken. Interbody devices at L4-L5 and L5-S1. Overlying retractors. IMPRESSION: Two intraoperative fluoroscopic images of the lumbosacral  spine and pelvis, as described. Correlate with the operative history. Electronically Signed   By: Jackey Loge D.O.   On: 09/09/2020 13:20   DG C-Arm 1-60 Min  Result Date: 09/09/2020 CLINICAL DATA:  Surgery, elective Z41.9 (ICD-10-CM). Additional history provided by technologist: L4-5/5-S1 PLIF to ilium. Provided fluoroscopy time 41 seconds (23.21 mGy). EXAM: LUMBAR SPINE - 2-3 VIEW; DG C-ARM 1-60 MIN COMPARISON:  CT of the lumbar spine and pelvis 08/17/2020. FINDINGS: AP and lateral view intraoperative fluoroscopic images of the lumbosacral spine and pelvis are submitted, 2 images total. The lowest well-formed intervertebral disc space is designated L5-S1. On the provided images, bilateral pedicle screws are present at the L4, L5 and S1 levels. Screws are also present within the iliac bones bilaterally. Vertical interconnecting rods were not present at the time the images were taken. Interbody devices at L4-L5 and L5-S1. Overlying retractors. IMPRESSION: Two intraoperative fluoroscopic images of the lumbosacral spine and pelvis, as described. Correlate with the operative history. Electronically Signed   By: Jackey Loge D.O.   On: 09/09/2020 13:20     Treatments: surgery: Bilateral L4-5 decompressive laminotomies and foraminotomies, more than would be required for simple interbody fusion alone.  Bilateral L5-S1 Gill procedure with bilateral L5 and S1 decompressive foraminotomies, more than would be required for simple interbody fusion alone.  L4-5, L5-S1 posterior lumbar interbody fusion utilizing interbody cages, local harvested autograft, and morselized allograft  L4, L5, S1, S2 posterior lateral arthrodesis utilizing segmental pedicle screw fixation and including S2 alar iliac wing fixation,  Mazor stereotactic robot guidance for instrumentation  Discharge Exam: Blood pressure 117/72, pulse 79, temperature 98.6 F (37 C), temperature source Oral, resp. rate 16, height 5\' 4"  (1.626 m), weight  63.5 kg, SpO2 98 %.  Alert and oriented x 4  PERRLA CN II-XII grossly intact MAE, Strength and sensation intact Incision is covered with Honeycomb dressing and Steri Strips; Dressing is clean, dry, and intact   Disposition:   Discharge Instructions      Remove dressing in 72 hours   Complete by: As directed    Call MD for:  difficulty breathing, headache or visual disturbances   Complete by: As directed    Call MD for:  hives   Complete by: As directed    Call MD for:  persistant dizziness or light-headedness   Complete by: As directed    Call MD for:  persistant nausea and vomiting   Complete by: As directed    Call MD for:  redness, tenderness, or signs of infection (pain, swelling, redness, odor or green/yellow discharge around incision site)   Complete by: As directed    Call MD for:  severe uncontrolled pain   Complete by: As directed    Call MD for:  temperature >100.4   Complete by: As directed    Diet - low sodium heart healthy   Complete by: As directed    Driving Restrictions   Complete by: As directed    No driving for 2 weeks, no riding in the car for 1 week   Face-to-face encounter (required for Medicare/Medicaid patients)   Complete by: As directed    I Floreen Comber certify that this patient is under my care and that I, or a nurse practitioner or physician's assistant working with me, had a face-to-face encounter that meets the physician face-to-face encounter requirements with this patient on 09/12/2020. The encounter with the patient was in whole, or in part for the following medical condition(s) which is the primary reason for home health care (List medical condition): Lumbar stenosis   The encounter with the patient was in whole, or in part, for the following medical condition, which is the primary reason for home health care: Lumbar stenosis   I certify that, based on my findings, the following services are medically necessary home health services: Physical  therapy   Reason for Medically Necessary Home Health Services: Therapy- Therapeutic Exercises to Increase Strength and Endurance   My clinical findings support the need for the above services: Unable to leave home safely without assistance and/or assistive device   Further, I certify that my clinical findings support that this patient is homebound due to: Unable to leave home safely without assistance   Home Health   Complete by: As directed    To provide the following care/treatments:  PT OT     Increase activity slowly   Complete by: As directed    Lifting restrictions   Complete by: As directed    No lifting more than 8 lbs      Allergies as of 09/12/2020   No Known Allergies      Medication List     TAKE these medications    amLODipine 10 MG tablet Commonly known as: NORVASC Take 1 tablet (10 mg total) by mouth daily.   diphenhydramine-acetaminophen 25-500 MG Tabs tablet Commonly known as: TYLENOL PM Take 1 tablet by mouth at bedtime as needed (sleep).   methocarbamol 500 MG tablet Commonly known as: Robaxin Take 1 tablet (500 mg total) by mouth 4 (four) times daily.   oxyCODONE-acetaminophen 5-325 MG tablet Commonly known as: Percocet Take 1 tablet by mouth every 4 (four) hours as needed for severe pain.   tetrahydrozoline-zinc 0.05-0.25 % ophthalmic solution Commonly known as: Chiropodist  2 drops into both eyes daily as needed (dry eyes).   vitamin C 500 MG tablet Commonly known as: ASCORBIC ACID Take 500 mg by mouth daily.               Durable Medical Equipment  (From admission, onward)           Start     Ordered   09/12/20 1105  For home use only DME 3 n 1  Once        09/12/20 1105   09/11/20 1045  For home use only DME Walker rolling  Once       Question Answer Comment  Walker: With 5 Inch Wheels   Patient needs a walker to treat with the following condition Weakness      09/11/20 1045   09/09/20 1509  DME Walker rolling  Once        Question:  Patient needs a walker to treat with the following condition  Answer:  Degenerative spondylolisthesis   09/09/20 1508   09/09/20 1509  DME 3 n 1  Once        09/09/20 1508            Follow-up Information     Julio Sicks, MD Follow up.   Specialty: Neurosurgery Contact information: [address] [place] [address] [phone]         [facility] Follow up.   Specialty: Home Health Services Contact information: [address] [place] [address] [place] [address] [phone]                 Signed: Val Eagle, DNP, AGNP-C Nurse Practitioner  [facility] [address], [place], [place] [address] P: [phone]    F: [phone]  09/12/2020, 11:55 AM

## 2020-09-12 NOTE — Progress Notes (Signed)
Patient discharged to home with instructions and equipments. 

## 2020-09-12 NOTE — TOC Initial Note (Signed)
Transition of Care [facility]) - Initial/Assessment Note    Patient Details  Name: Elaine [name] MRN: [id number] Date of Birth: [birth year]  Transition [facility]) CM/SW Contact:    Kingsley Plan, RN Phone Number: 09/12/2020, 11:09 AM  Clinical Narrative:                 Patient from home alone. Discussed PT/OT evals for home health PT/OT and 24 hour supervision.  Patient's son lives in [place] but works in [place], he will check and her daily. Patient states she has a friend who can assist also.   Stacie with [facility] accepted referral for HHPT/OT. Secure chatted NP for orders and face to face.   Ordered walker and 3 in1 with [facility].   Expected Discharge Plan: Home w Home Health Services     Patient Goals and CMS Choice Patient states their goals for this hospitalization and ongoing recovery are:: to go home CMS Medicare.gov Compare Post Acute Care list provided to:: Patient Choice offered to / list presented to : Patient  Expected Discharge Plan and Services Expected Discharge Plan: Home w Home Health Services   Discharge Planning Services: CM Consult Post Acute Care Choice: Home Health, Durable Medical Equipment Living arrangements for the past 2 months: Single Family Home Expected Discharge Date: 09/10/20               DME Arranged: 3-N-1, Walker rolling DME Agency: [facility] Date DME Agency Contacted: 09/12/20 Time DME Agency Contacted: [phone] Representative spoke with at DME Agency: Velna Hatchet HH Arranged: PT, OT HH Agency: [facility] ([facility] Date HH Agency Contacted: 09/12/20 Time HH Agency Contacted: 1109 Representative spoke with at [facility] Agency: Stacie  Prior Living Arrangements/Services Living arrangements for the past 2 months: Single Family Home Lives with:: Self Patient language and need for interpreter reviewed:: Yes Do you feel safe going back to the place where you live?: Yes      Need for Family  Participation in Patient Care: Yes (Comment) Care giver support system in place?: Yes (comment)   Criminal Activity/Legal Involvement Pertinent to Current Situation/Hospitalization: No - Comment as needed  Activities of Daily Living Home Assistive Devices/Equipment: Scales, Eyeglasses, Shower chair without back, Grab bars in shower ADL Screening (condition at time of admission) Patient's cognitive ability adequate to safely complete daily activities?: Yes Is the patient deaf or have difficulty hearing?: No Does the patient have difficulty seeing, even when wearing glasses/contacts?: Yes Does the patient have difficulty concentrating, remembering, or making decisions?: Yes Patient able to express need for assistance with ADLs?: Yes Does the patient have difficulty dressing or bathing?: No Independently performs ADLs?: Yes (appropriate for developmental age) Does the patient have difficulty walking or climbing stairs?: Yes Weakness of Legs: Both Weakness of Arms/Hands: Left  Permission Sought/Granted   Permission granted to share information with : No              Emotional Assessment Appearance:: Appears stated age Attitude/Demeanor/Rapport: Engaged Affect (typically observed): Accepting Orientation: : Oriented to Self, Oriented to Place, Oriented to  Time, Oriented to Situation Alcohol / Substance Use: Not Applicable Psych Involvement: No (comment)  Admission diagnosis:  Spondylolisthesis at L5-S1 level [M43.17] Patient Active Problem List   Diagnosis Date Noted   Spondylolisthesis at L5-S1 level 09/09/2020   Pruritic rash 02/03/2014   Tobacco abuse 02/03/2014   Other and unspecified hyperlipidemia 07/11/2013   Leg pain, bilateral 06/02/2013   Cataract, left eye 04/21/2011  Low back pain 12/30/2010   Routine health maintenance 12/30/2010   Depression 12/30/2010   Essential hypertension 04/08/2008   SHOULDER PAIN, LEFT 04/08/2008   PCP:  [facility]:   [facility] [address] [place] [address] Phone: [phone] Fax: [phone]  [facility] [address] [place], [place] - [address] [place] [address] Phone: [phone] Fax: [phone]     Social Determinants of Health (SDOH) Interventions    Readmission Risk Interventions No flowsheet data found.

## 2020-09-13 MED FILL — Heparin Sodium (Porcine) Inj 1000 Unit/ML: INTRAMUSCULAR | Qty: 30 | Status: AC

## 2020-09-13 MED FILL — Sodium Chloride IV Soln 0.9%: INTRAVENOUS | Qty: 1000 | Status: AC

## 2020-09-14 ENCOUNTER — Encounter (HOSPITAL_COMMUNITY): Payer: Self-pay | Admitting: Neurosurgery
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
## 2021-04-05 ENCOUNTER — Ambulatory Visit: Payer: Medicaid Other | Attending: Neurosurgery | Admitting: Physical Therapy

## 2022-04-02 IMAGING — RF DG LUMBAR SPINE 2-3V
1 series · 2 of 2 positions shown · non-contrast
Comparison: CT of the lumbar spine and pelvis 08/17/2020.

CLINICAL DATA: Surgery, elective LSY.G (JSH-R4-CM). Additional
history provided by [HOSPITAL]-[DATE]-S1 PLIF to ilium. Provided
fluoroscopy time 41 seconds (23.21 mGy).

EXAM:
LUMBAR SPINE - 2-3 VIEW; DG C-ARM 1-60 MIN

[Series 1: run · 2 of 2 slices shown]
[im 1/2]
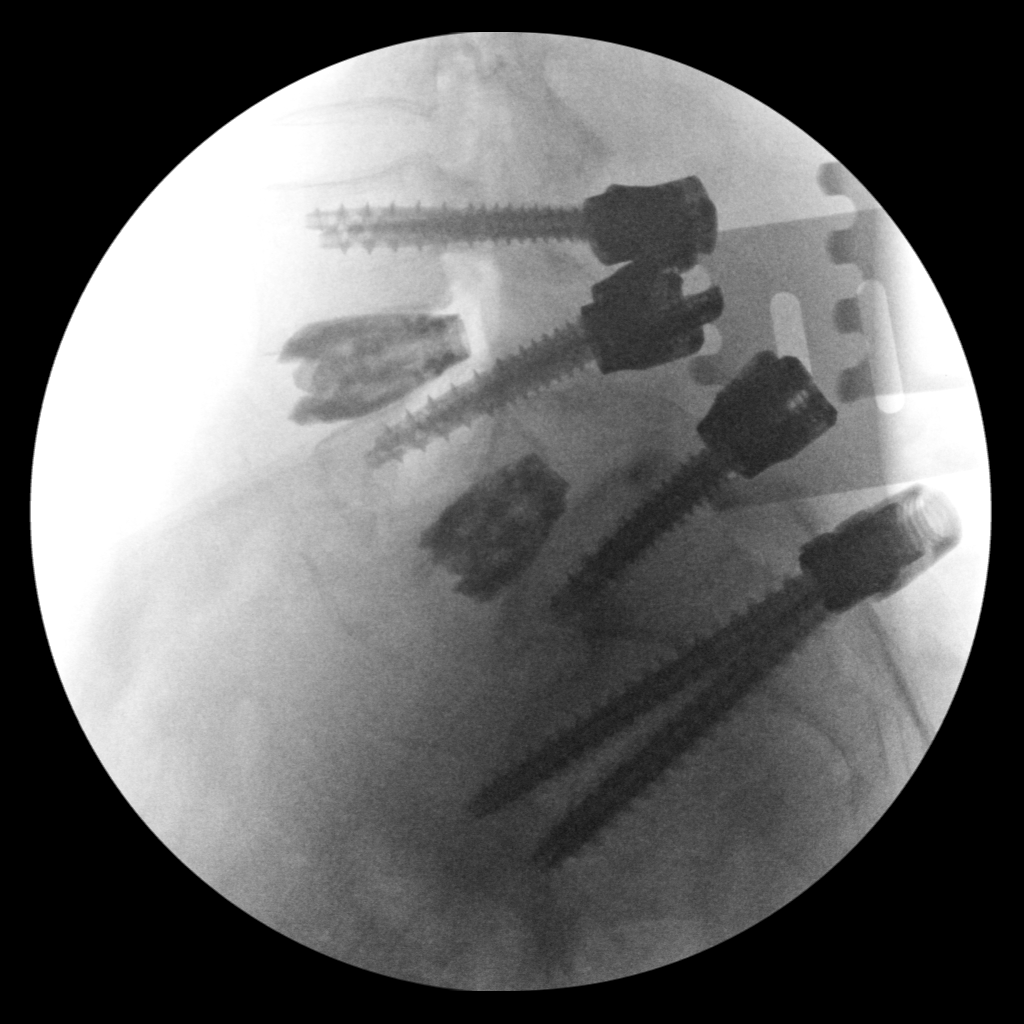
[im 2/2]
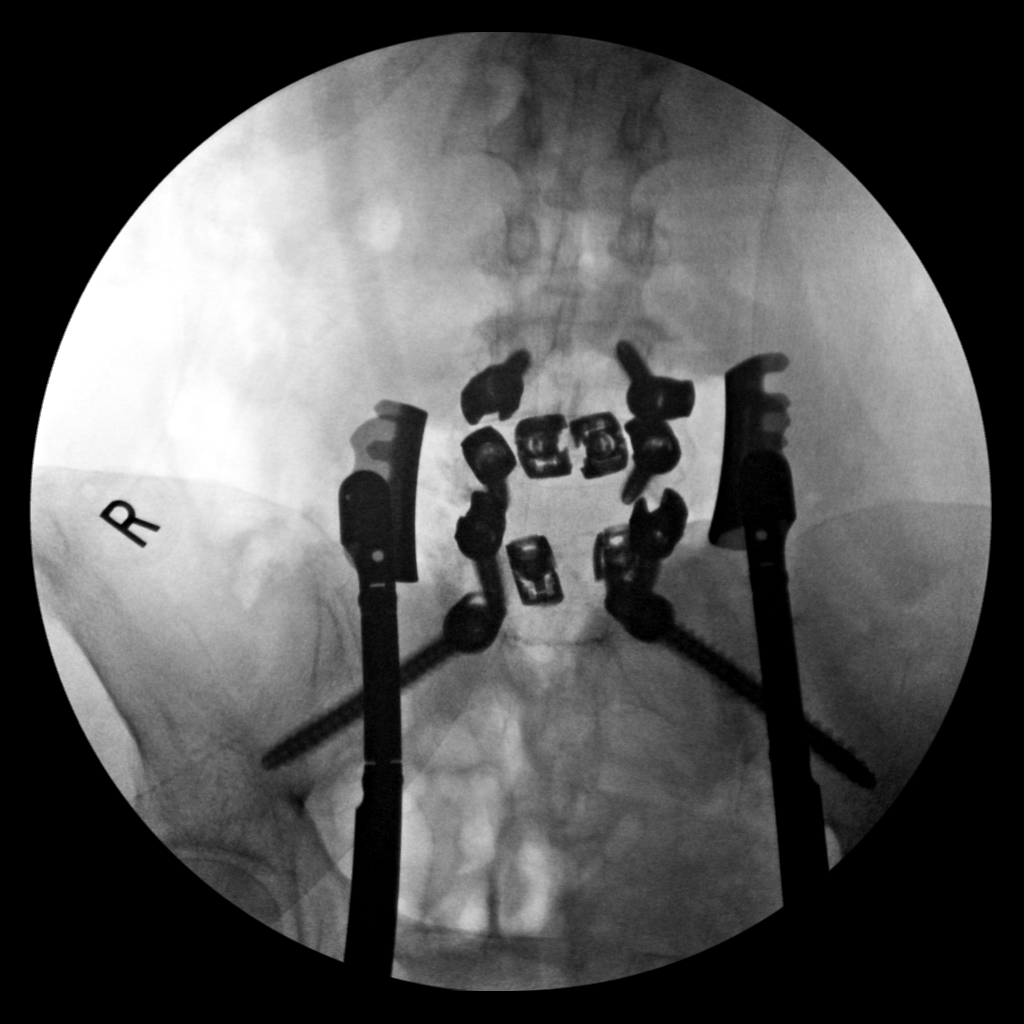

[2 of 2 positions shown; findings below may reference images not displayed]

FINDINGS: AP and lateral view intraoperative fluoroscopic images of the
lumbosacral spine and pelvis are submitted, 2 images total. The
lowest well-formed intervertebral disc space is designated L5-S1. On
the provided images, bilateral pedicle screws are present at the L4,
L5 and S1 levels. Screws are also present within the iliac bones
bilaterally. Vertical interconnecting rods were not present at the
time the images were taken. Interbody devices at L4-L5 and L5-S1.
Overlying retractors.
IMPRESSION: Two intraoperative fluoroscopic images of the lumbosacral spine and
pelvis, as described. Correlate with the operative history.
